# Patient Record
Sex: Female | Born: 1995 | Race: Black or African American | Hispanic: No | Marital: Single | Smoking: Never smoker
Health system: Southern US, Community
[De-identification: ages and names within clinical notes are randomized; demographics above are authoritative.]

## PROBLEM LIST (undated history)

## (undated) ENCOUNTER — Inpatient Hospital Stay (HOSPITAL_COMMUNITY): Payer: Self-pay

## (undated) DIAGNOSIS — Z789 Other specified health status: Secondary | ICD-10-CM

## (undated) HISTORY — PX: LIPOSUCTION: SHX10

## (undated) HISTORY — PX: NO PAST SURGERIES: SHX2092

---

## 2015-01-23 ENCOUNTER — Emergency Department (HOSPITAL_BASED_OUTPATIENT_CLINIC_OR_DEPARTMENT_OTHER)
Admission: EM | Admit: 2015-01-23 | Discharge: 2015-01-23 | Disposition: A | Discharge status: Home / Self Care | Payer: Self-pay | Attending: Emergency Medicine | Admitting: Emergency Medicine

## 2015-01-23 ENCOUNTER — Encounter (HOSPITAL_BASED_OUTPATIENT_CLINIC_OR_DEPARTMENT_OTHER): Payer: Self-pay | Admitting: *Deleted

## 2015-01-23 DIAGNOSIS — K088 Other specified disorders of teeth and supporting structures: Secondary | ICD-10-CM | POA: Insufficient documentation

## 2015-01-23 DIAGNOSIS — K047 Periapical abscess without sinus: Secondary | ICD-10-CM | POA: Insufficient documentation

## 2015-01-23 DIAGNOSIS — K0889 Other specified disorders of teeth and supporting structures: Secondary | ICD-10-CM

## 2015-01-23 MED ORDER — HYDROCODONE-ACETAMINOPHEN 5-325 MG PO TABS: ORAL_TABLET | ORAL | Status: DC

## 2015-01-23 MED ORDER — AMOXICILLIN 500 MG PO CAPS: 500.0000 mg | ORAL_CAPSULE | Freq: Three times a day (TID) | ORAL | Status: DC

## 2015-01-23 MED ORDER — IBUPROFEN 800 MG PO TABS
800.0000 mg | ORAL_TABLET | Freq: Once | ORAL | Status: AC
  Administered 2015-01-23: 800 mg via ORAL
  Filled 2015-01-23: qty 1

## 2015-01-23 MED ORDER — AMOXICILLIN 500 MG PO CAPS
500.0000 mg | ORAL_CAPSULE | Freq: Once | ORAL | Status: AC
  Administered 2015-01-23: 500 mg via ORAL
  Filled 2015-01-23: qty 1

## 2015-01-23 NOTE — ED Notes (Signed)
Dental pain since yesterday.  

## 2015-01-23 NOTE — ED Provider Notes (Signed)
CSN: [id number]     Arrival date & time 01/23/15  1536 History   First MD Initiated Contact with Patient 01/23/15 1541     Chief Complaint  Patient presents with  . Dental Pain     (Consider location/radiation/quality/duration/timing/severity/associated sxs/prior Treatment) HPI   Blood pressure 116/85, pulse 72, temperature 98.4 F (36.9 C), temperature source Oral, resp. rate 20, height 5\' 3"  (1.6 m), weight 100 lb (45.36 kg), last menstrual period 01/04/2015, SpO2 100 %.  Brandy Mckenzie is a 19 y.o. female complaining of right-sided dental pain with associated cheek swelling onset yesterday, no pain medication is taken prior to arrival. She rates her pain at 8 out of 10. She states that she has a Education officer, community in [place] but she was transferring to [place] and she was advised she try to make an appointment that she would have to come to the emergency room to be evaluated first. Denies fever/chills, difficulty opening jaw, difficulty swallowing, SOB,  neck swelling.   History reviewed. No pertinent past medical history. History reviewed. No pertinent past surgical history. No family history on file. History  Substance Use Topics  . Smoking status: Never Smoker   . Smokeless tobacco: Not on file  . Alcohol Use: No   OB History    No data available     Review of Systems  10 systems reviewed and found to be negative, except as noted in the HPI.   Allergies  Review of patient's allergies indicates no known allergies.  Home Medications   Prior to Admission medications   Medication Sig Start Date End Date Taking? Authorizing Provider  amoxicillin (AMOXIL) 500 MG capsule Take 1 capsule (500 mg total) by mouth 3 (three) times daily. 01/23/15   Alpha Chouinard, PA-C  HYDROcodone-acetaminophen (NORCO/VICODIN) 5-325 MG per tablet Take 1-2 tablets by mouth every 6 hours as needed for pain and/or cough. 01/23/15   Erandi Lemma, PA-C   BP 116/85 mmHg  Pulse 72  Temp(Src)  98.4 F (36.9 C) (Oral)  Resp 20  Ht 5\' 3"  (1.6 m)  Wt 100 lb (45.36 kg)  BMI 17.72 kg/m2  SpO2 100%  LMP 01/04/2015 Physical Exam  Constitutional: She is oriented to person, place, and time. She appears well-developed and well-nourished. No distress.  HENT:  Head: Normocephalic.  Mouth/Throat: Uvula is midline and oropharynx is clear and moist. No trismus in the jaw. No uvula swelling. No oropharyngeal exudate, posterior oropharyngeal edema, posterior oropharyngeal erythema or tonsillar abscesses.  Slight right-sided cheek swelling, no cellulitis or overlying skin changes. Generally good dentition, no gingival swelling, erythema. Patient is tender to palpation on the right upper gums. There is no focal fluctuance.  Patient is handling their secretions. There is no tenderness to palpation or firmness underneath tongue bilaterally. No trismus.    Eyes: Conjunctivae and EOM are normal.  Cardiovascular: Normal rate.   Pulmonary/Chest: Effort normal. No stridor.  Musculoskeletal: Normal range of motion.  Lymphadenopathy:    She has no cervical adenopathy.  Neurological: She is alert and oriented to person, place, and time.  Psychiatric: She has a normal mood and affect.  Nursing note and vitals reviewed.   ED Course  Procedures (including critical care time) Labs Review Labs Reviewed - No data to display  Imaging Review No results found.   EKG Interpretation None      MDM   Final diagnoses:  Pain, dental  Dental abscess    Filed Vitals:   01/23/15 1541  BP: 116/85  Pulse:  72  Temp: 98.4 F (36.9 C)  TempSrc: Oral  Resp: 20  Height:  (1.6 m)  Weight: 100 lb (45.36 kg)  SpO2: 100%    Medications  ibuprofen (ADVIL,MOTRIN) tablet 800 mg (not administered)  amoxicillin (AMOXIL) capsule 500 mg (not administered)    Brandy Mckenzie is a pleasant 19 y.o. female presenting with dental pain and early dental abscess. No signs of deep tissue infection. Patient  can make an appointment with the dentist after she is seen in the ED.  Evaluation does not show pathology that would require ongoing emergent intervention or inpatient treatment. Pt is hemodynamically stable and mentating appropriately. Discussed findings and plan with patient/guardian, who agrees with care plan. All questions answered. Return precautions discussed and outpatient follow up given.   New Prescriptions   AMOXICILLIN (AMOXIL) 500 MG CAPSULE    Take 1 capsule (500 mg total) by mouth 3 (three) times daily.   HYDROCODONE-ACETAMINOPHEN (NORCO/VICODIN) 5-325 MG PER TABLET    Take 1-2 tablets by mouth every 6 hours as needed for pain and/or cough.         Wynetta Emery, PA-C 01/23/15 1612  Gwyneth Sprout, MD 01/23/15 [phone]

## 2015-01-23 NOTE — Discharge Instructions (Signed)
For pain control please take ibuprofen (also known as Motrin or Advil) 800mg  (this is normally 4 over the counter pills) 3 times a day  for 5 days. Take with food to minimize stomach irritation.  Take vicodin for breakthrough pain, do not drink alcohol, drive, care for children or do other critical tasks while taking vicodin.  Return to the emergency room for fever, change in vision, redness to the face that rapidly spreads towards the eye, nausea or vomiting, difficulty swallowing or shortness of breath.   Apply warm compresses to jaw throughout the day.   Take your antibiotics as directed and to the end of the course.  Followup with a dentist is very important for ongoing evaluation and management of recurrent dental pain. Return to emergency department for emergent changing or worsening symptoms.   Abscessed Tooth An abscessed tooth is an infection around your tooth. It may be caused by holes or damage to the tooth (cavity) or a dental disease. An abscessed tooth causes mild to very bad pain in and around the tooth. See your dentist right away if you have tooth or gum pain. HOME CARE  Take your medicine as told. Finish it even if you start to feel better.  Do not drive after taking pain medicine.  Rinse your mouth (gargle) often with salt water ( teaspoon salt in 8 ounces of warm water).  Do not apply heat to the outside of your face. GET HELP RIGHT AWAY IF:   You have a temperature by mouth above 102 F (38.9 C), not controlled by medicine.  You have chills and a very bad headache.  You have problems breathing or swallowing.  Your mouth will not open.  You develop puffiness (swelling) on the neck or around the eye.  Your pain is not helped by medicine.  Your pain is getting worse instead of better. MAKE SURE YOU:   Understand these instructions.  Will watch your condition.  Will get help right away if you are not doing well or get worse. Document Released:  01/08/2008 Document Revised: 10/14/2011 Document Reviewed: 10/30/2010 [facility] Patient Information 2015 [place], [place]. This information is not intended to replace advice given to you by your health care provider. Make sure you discuss any questions you have with your health care provider.   Low-cost dental clinic: [facility]  at [phone]**  **Nuala Alpha at [phone] [address]**    You may also call [phone]  Dental Assistance If the dentist on-call cannot see you, please use the resources below:   Patients with Medicaid: [facility] [id number], [phone] [address], [phone]  If unable to pay, or uninsured, contact [facility] [phone]) or [facility] Department [phone] in [place], [phone] in [facility]) to become qualified for the adult dental clinic  Other Low-Cost Community Dental Services: [facility]- [address] Natasha Bence [place], [place], [address]    [phone], Ext. [phone]    2nd and 4th Thursday of the month at 6:30am    10 clients each day by appointment, can sometimes see walk-in     patients if someone does not show for an appointment [facility]- [address] Ether Griffins [place], [place], [address]    [phone] [facility] [address] [place], [place], [address]    [phone]  [facility] Department- [phone] [facility] Department- [phone] [facility] Department- [phone]

## 2015-06-19 ENCOUNTER — Emergency Department (HOSPITAL_BASED_OUTPATIENT_CLINIC_OR_DEPARTMENT_OTHER): Payer: Medicaid Other

## 2015-06-19 ENCOUNTER — Encounter (HOSPITAL_BASED_OUTPATIENT_CLINIC_OR_DEPARTMENT_OTHER): Payer: Self-pay

## 2015-06-19 ENCOUNTER — Emergency Department (HOSPITAL_BASED_OUTPATIENT_CLINIC_OR_DEPARTMENT_OTHER)
Admission: EM | Admit: 2015-06-19 | Discharge: 2015-06-19 | Disposition: A | Discharge status: Home / Self Care | Payer: Medicaid Other | Attending: Emergency Medicine | Admitting: Emergency Medicine

## 2015-06-19 DIAGNOSIS — Z3A14 14 weeks gestation of pregnancy: Secondary | ICD-10-CM | POA: Insufficient documentation

## 2015-06-19 DIAGNOSIS — R102 Pelvic and perineal pain unspecified side: Secondary | ICD-10-CM

## 2015-06-19 DIAGNOSIS — R51 Headache: Secondary | ICD-10-CM | POA: Diagnosis not present

## 2015-06-19 DIAGNOSIS — O9989 Other specified diseases and conditions complicating pregnancy, childbirth and the puerperium: Secondary | ICD-10-CM | POA: Diagnosis not present

## 2015-06-19 DIAGNOSIS — N898 Other specified noninflammatory disorders of vagina: Secondary | ICD-10-CM | POA: Insufficient documentation

## 2015-06-19 DIAGNOSIS — R630 Anorexia: Secondary | ICD-10-CM | POA: Insufficient documentation

## 2015-06-19 DIAGNOSIS — R55 Syncope and collapse: Secondary | ICD-10-CM | POA: Insufficient documentation

## 2015-06-19 DIAGNOSIS — R1032 Left lower quadrant pain: Secondary | ICD-10-CM

## 2015-06-19 DIAGNOSIS — O26892 Other specified pregnancy related conditions, second trimester: Secondary | ICD-10-CM

## 2015-06-19 DIAGNOSIS — R109 Unspecified abdominal pain: Secondary | ICD-10-CM

## 2015-06-19 LAB — URINALYSIS, ROUTINE W REFLEX MICROSCOPIC
BILIRUBIN URINE: NEGATIVE
Glucose, UA: NEGATIVE mg/dL
HGB URINE DIPSTICK: NEGATIVE
KETONES UR: NEGATIVE mg/dL
Leukocytes, UA: NEGATIVE
NITRITE: NEGATIVE
Protein, ur: NEGATIVE mg/dL
SPECIFIC GRAVITY, URINE: 1.022 (ref 1.005–1.030)
Urobilinogen, UA: 1 mg/dL (ref 0.0–1.0)
pH: 6.5 (ref 5.0–8.0)

## 2015-06-19 LAB — CBC WITH DIFFERENTIAL/PLATELET
Basophils Absolute: 0 10*3/uL (ref 0.0–0.1)
Basophils Relative: 0 %
EOS ABS: 0.5 10*3/uL (ref 0.0–0.7)
EOS PCT: 5 %
HEMATOCRIT: 36.9 % (ref 36.0–46.0)
HEMOGLOBIN: 12.9 g/dL (ref 12.0–15.0)
LYMPHS ABS: 2.3 10*3/uL (ref 0.7–4.0)
Lymphocytes Relative: 23 %
MCH: 31.6 pg (ref 26.0–34.0)
MCHC: 35 g/dL (ref 30.0–36.0)
MCV: 90.4 fL (ref 78.0–100.0)
MONOS PCT: 11 %
Monocytes Absolute: 1.1 10*3/uL — ABNORMAL HIGH (ref 0.1–1.0)
Neutro Abs: 6.2 10*3/uL (ref 1.7–7.7)
Neutrophils Relative %: 61 %
Platelets: 226 10*3/uL (ref 150–400)
RBC: 4.08 MIL/uL (ref 3.87–5.11)
RDW: 11.7 % (ref 11.5–15.5)
WBC: 10.1 10*3/uL (ref 4.0–10.5)

## 2015-06-19 LAB — COMPREHENSIVE METABOLIC PANEL
ALK PHOS: 32 U/L — AB (ref 38–126)
ALT: 19 U/L (ref 14–54)
ANION GAP: 5 (ref 5–15)
AST: 22 U/L (ref 15–41)
Albumin: 3.4 g/dL — ABNORMAL LOW (ref 3.5–5.0)
BILIRUBIN TOTAL: 0.5 mg/dL (ref 0.3–1.2)
BUN: 10 mg/dL (ref 6–20)
CALCIUM: 9 mg/dL (ref 8.9–10.3)
CO2: 25 mmol/L (ref 22–32)
CREATININE: 0.53 mg/dL (ref 0.44–1.00)
Chloride: 105 mmol/L (ref 101–111)
GFR calc non Af Amer: >60 mL/min (ref >60)
Glucose, Bld: 81 mg/dL (ref 65–99)
Potassium: 3.4 mmol/L — ABNORMAL LOW (ref 3.5–5.1)
Sodium: 135 mmol/L (ref 135–145)
TOTAL PROTEIN: 6.9 g/dL (ref 6.5–8.1)

## 2015-06-19 LAB — WET PREP, GENITAL
TRICH WET PREP: NONE SEEN
YEAST WET PREP: NONE SEEN

## 2015-06-19 LAB — LIPASE, BLOOD: Lipase: 33 U/L (ref 11–51)

## 2015-06-19 MED ORDER — METRONIDAZOLE 500 MG PO TABS: 500.0000 mg | ORAL_TABLET | Freq: Two times a day (BID) | ORAL | Status: DC

## 2015-06-19 MED ORDER — ACETAMINOPHEN 325 MG PO TABS
650.0000 mg | ORAL_TABLET | Freq: Once | ORAL | Status: AC
  Administered 2015-06-19: 650 mg via ORAL
  Filled 2015-06-19: qty 2

## 2015-06-19 NOTE — ED Notes (Signed)
Attempted to obtain blood for labs ordered by EDP, pt refused blood draw, but would not give a primary reason, pt did state she was "scared" explained rationale for lab sample and importance for EDP to obtain a correct diagnosis so that we could provide the appropriate care for her, pt still refused, will reck with pt in 5 min

## 2015-06-19 NOTE — ED Notes (Signed)
C/o lower abd pain x 4 days-[redacted] weeks pregnant-states OB nurse advised pt to go to ED or schedule appt-denies vaginal bleeding and d/c-G1

## 2015-06-19 NOTE — ED Notes (Signed)
Pt agreed to blood draw, samples obtained and to lab per EDP orders

## 2015-06-19 NOTE — ED Notes (Signed)
Attempted to administer patient tylenol.  Patient in ultrasound

## 2015-06-19 NOTE — ED Notes (Signed)
Pt presents with left lower quad abd pain, with onset this past Friday. Describes as aching pain, appetite poor, denies any N/V/D, denies any fevers

## 2015-06-19 NOTE — ED Provider Notes (Signed)
CSN: [id number]     Arrival date & time 06/19/15  1641 History  By signing my name below, I, Budd Palmer, attest that this documentation has been prepared under the direction and in the presence of Leta Baptist, MD. Electronically Signed: Budd Palmer, ED Scribe. 06/19/2015. 6:17 PM.    Chief Complaint  Patient presents with  . Abdominal Pain   The history is provided by the patient. No language interpreter was used.   HPI Comments: Brandy Mckenzie is a 19 y.o. female who is [redacted] weeks pregnant and presents to the Emergency Department complaining of constant, aching LLQ abdominal pain onset 3 days ago. She reports associated HA and loss of appetite. She notes she is sexually active with one partner and denies a PMHx of STD's. She notes she was last seen by her OB-GYN at 8 weeks, at which point a normal [place] was performed. She denies a PMHx of UTI. Pt denies fever, chills, n/v/d, vaginal discharge, bleeding, or pain, as well as dysuria.   History reviewed. No pertinent past medical history. History reviewed. No pertinent past surgical history. No family history on file. Social History  Substance Use Topics  . Smoking status: Never Smoker   . Smokeless tobacco: None  . Alcohol Use: No   OB History    Gravida Para Term Preterm AB TAB SAB Ectopic Multiple Living   1              Review of Systems  Constitutional: Positive for appetite change. Negative for fever and chills.  Gastrointestinal: Positive for abdominal pain. Negative for nausea, vomiting and diarrhea.  Genitourinary: Negative for dysuria, vaginal bleeding, vaginal discharge and vaginal pain.  Neurological: Positive for headaches.    Allergies  Review of patient's allergies indicates no known allergies.  Home Medications   Prior to Admission medications   Medication Sig Start Date End Date Taking? Authorizing Provider  metroNIDAZOLE (FLAGYL) 500 MG tablet Take 1 tablet (500 mg total) by mouth 2 (two) times  daily. 06/19/15   Leta Baptist, MD   BP 122/87 mmHg  Pulse 79  Temp(Src) 98.1 F (36.7 C) (Oral)  Resp 16  SpO2 100%  LMP 01/04/2015 Physical Exam  Constitutional: She is oriented to person, place, and time. She appears well-developed and well-nourished. No distress.  HENT:  Head: Normocephalic and atraumatic.  Right Ear: External ear normal.  Left Ear: External ear normal.  Nose: Nose normal.  Mouth/Throat: Oropharynx is clear and moist. No oropharyngeal exudate.  Eyes: EOM are normal. Pupils are equal, round, and reactive to light.  Neck: Normal range of motion. Neck supple.  Cardiovascular: Normal rate, regular rhythm, normal heart sounds and intact distal pulses.   No murmur heard. Pulmonary/Chest: Effort normal. No respiratory distress. She has no wheezes. She has no rales.  Abdominal: Soft. She exhibits no distension and no mass. There is tenderness (LLQ). There is no rebound and no guarding.  Genitourinary: Uterus is enlarged (consistent with dates). Uterus is not deviated and not tender. Cervix exhibits discharge (mild, whitish). Cervix exhibits no motion tenderness. Right adnexum displays no mass, no tenderness and no fullness. Left adnexum displays tenderness. Left adnexum displays no mass and no fullness. No foreign body around the vagina. No signs of injury around the vagina. Vaginal discharge found.  Musculoskeletal: Normal range of motion. She exhibits no edema or tenderness.  Neurological: She is alert and oriented to person, place, and time.  Skin: Skin is warm and dry. No rash noted.  She is not diaphoretic.  Vitals reviewed.   ED Course  Procedures  DIAGNOSTIC STUDIES: Oxygen Saturation is 100% on RA, normal by my interpretation.    COORDINATION OF CARE: 6:16 PM - Discussed plans to perform a pelvic exam and to order diagnostic studies. Pt advised of plan for treatment and pt agrees.  Labs Review Labs Reviewed  WET PREP, GENITAL - Abnormal; Notable for the  following:    Clue Cells Wet Prep HPF POC FEW (*)    WBC, Wet Prep HPF POC FEW (*)    All other components within normal limits  CBC WITH DIFFERENTIAL/PLATELET - Abnormal; Notable for the following:    Monocytes Absolute 1.1 (*)    All other components within normal limits  COMPREHENSIVE METABOLIC PANEL - Abnormal; Notable for the following:    Potassium 3.4 (*)    Albumin 3.4 (*)    Alkaline Phosphatase 32 (*)    All other components within normal limits  URINALYSIS, ROUTINE W REFLEX MICROSCOPIC (NOT AT [facility])  LIPASE, BLOOD  GC/CHLAMYDIA PROBE AMP ([place]) NOT AT [facility]    Imaging Review Kore[place] Ob Limited  06/19/2015  CLINICAL DATA:  Acute onset of left pelvic and flank pain. Initial encounter. EXAM: LIMITED OBSTETRIC ULTRASOUND AND DOPPLER FINDINGS: Number of Fetuses: 1 Heart Rate:  144 bpm Movement: Yes Presentation: Cephalic Placental Location: Posterior Previa: No Amniotic Fluid (Subjective):  Within normal limits. BPD:  2.8 cm    15 w 0 d MATERNAL FINDINGS: Cervix:  Appears closed. Uterus/Adnexae: No abnormality visualized. Pulsed Doppler evaluation of the left ovary demonstrates normal low-resistance arterial and venous waveforms. The right ovary is not visualized on this study. Other findings No free fluid is seen within the pelvic cul-de-sac. IMPRESSION: 1. No evidence for ovarian torsion. The left ovary is unremarkable in appearance, and demonstrates normal arterial and venous waveforms. The right ovary is not visualized on this study. 2. Single live intrauterine pregnancy noted, with a biparietal diameter of 2.8 cm, corresponding to a gestational age of [redacted] weeks 0 days. This matches the gestational age of [redacted] weeks 3 days by LMP, reflecting an estimated date of delivery of Dec 15, 2015. 3. Cervix remains closed.  No evidence of placenta previa. This exam is performed on an emergent basis and does not comprehensively evaluate fetal size, dating, or anatomy; follow-up complete OB US should  be considered if further fetal assessment is warranted. Electronically Signed   By: Roanna RaiderJeffery  Chang M.D.   On: 06/19/2015 21:04   [place]as Renal  06/19/2015  CLINICAL DATA:  Left flank and left lower quadrant abdominal pain. Patient is [redacted] weeks pregnant. EXAM: RENAL / URINARY TRACT ULTRASOUND COMPLETE COMPARISON:  None. FINDINGS: Right Kidney: Length: 10.2 cm. Normal renal cortical thickness and echogenicity without focal lesions or hydronephrosis. No renal calculi. Left Kidney: Length: 9.6 cm. Normal renal cortical thickness and echogenicity without focal lesions or hydronephrosis. No renal calculi. Bladder: Normal. IMPRESSION: Normal renal ultrasound examination. Electronically Signed   By: Rudie MeyerP.  Gallerani M.D.   On: 06/19/2015 20:21   [place] Art/ven Flow Abd Pelv Doppler  06/19/2015  CLINICAL DATA:  Acute onset of left pelvic and flank pain. Initial encounter. EXAM: LIMITED OBSTETRIC ULTRASOUND AND DOPPLER FINDINGS: Number of Fetuses: 1 Heart Rate:  144 bpm Movement: Yes Presentation: Cephalic Placental Location: Posterior Previa: No Amniotic Fluid (Subjective):  Within normal limits. BPD:  2.8 cm    15 w 0 d MATERNAL FINDINGS: Cervix:  Appears closed. Uterus/Adnexae: No abnormality visualized. Pulsed  Doppler evaluation of the left ovary demonstrates normal low-resistance arterial and venous waveforms. The right ovary is not visualized on this study. Other findings No free fluid is seen within the pelvic cul-de-sac. IMPRESSION: 1. No evidence for ovarian torsion. The left ovary is unremarkable in appearance, and demonstrates normal arterial and venous waveforms. The right ovary is not visualized on this study. 2. Single live intrauterine pregnancy noted, with a biparietal diameter of 2.8 cm, corresponding to a gestational age of [redacted] weeks 0 days. This matches the gestational age of [redacted] weeks 3 days by LMP, reflecting an estimated date of delivery of Dec 15, 2015. 3. Cervix remains closed.  No evidence of placenta  previa. This exam is performed on an emergent basis and does not comprehensively evaluate fetal size, dating, or anatomy; follow-up complete OB US should be considered if further fetal assessment is warranted. Electronically Signed   By: Roanna Raider M.D.   On: 06/19/2015 21:04   I have personally reviewed and evaluated these images and lab results as part of my medical decision-making.   EKG Interpretation None      MDM  Patient seen and evaluated in stable condition.  Mild left lower quadrant pain.  Renal and pelvic US unremarkable.  UA Laboratory studies unremarkable other than wet prep positive for clue cells.  Discussed results with patient.  Likely pain secondary to early pregnancy physiologic changes and BV.  Patient just had cultures for GC/Chlamydia which were negative and so at this time was not empirically treated for GC/chlamydia but cultures were sent and patient was informed she would be contacted if they are positive.  Patient was instructed to follow up outpatient with her OB/PCP.  She was also instructed to return with fever, inability to tolerate PO, if pain moved to RLQ.  She expressed understanding and agreement with all discussed and she was discharged home in stable condition. Final diagnoses:  Abdominal pain, unspecified abdominal location    1. BV  2. Abdominal pain in pregnancy  I personally performed the services described in this documentation, which was scribed in my presence. The recorded information has been reviewed and is accurate.   Leta Baptist, MD 06/20/15 [phone]

## 2015-06-19 NOTE — Discharge Instructions (Signed)
The pain in your abdomen is likely related to the stretching of your pelvis and a bacterial infection in your vagina.  Take the medications as prescribed.  Follow up with your OB or primary care physician.  Return with inability to eat or drink or if the pain moves to the right side of your abdomen.  Abdominal Pain During Pregnancy Abdominal pain is common in pregnancy. Most of the time, it does not cause harm. There are many causes of abdominal pain. Some causes are more serious than others. Some of the causes of abdominal pain in pregnancy are easily diagnosed. Occasionally, the diagnosis takes time to understand. Other times, the cause is not determined. Abdominal pain can be a sign that something is very wrong with the pregnancy, or the pain may have nothing to do with the pregnancy at all. For this reason, always tell your health care provider if you have any abdominal discomfort. HOME CARE INSTRUCTIONS  Monitor your abdominal pain for any changes. The following actions may help to alleviate any discomfort you are experiencing:  Do not have sexual intercourse or put anything in your vagina until your symptoms go away completely.  Get plenty of rest until your pain improves.  Drink clear fluids if you feel nauseous. Avoid solid food as long as you are uncomfortable or nauseous.  Only take over-the-counter or prescription medicine as directed by your health care provider.  Keep all follow-up appointments with your health care provider. SEEK IMMEDIATE MEDICAL CARE IF:  You are bleeding, leaking fluid, or passing tissue from the vagina.  You have increasing pain or cramping.  You have persistent vomiting.  You have painful or bloody urination.  You have a fever.  You notice a decrease in your baby's movements.  You have extreme weakness or feel faint.  You have shortness of breath, with or without abdominal pain.  You develop a severe headache with abdominal pain.  You have  abnormal vaginal discharge with abdominal pain.  You have persistent diarrhea.  You have abdominal pain that continues even after rest, or gets worse. MAKE SURE YOU:   Understand these instructions.  Will watch your condition.  Will get help right away if you are not doing well or get worse.   This information is not intended to replace advice given to you by your health care provider. Make sure you discuss any questions you have with your health care provider.   Document Released: 07/22/2005 Document Revised: 05/12/2013 Document Reviewed: 02/18/2013 Elsevier Interactive Patient Education 2016 Elsevier Inc.   Bacterial Vaginosis Bacterial vaginosis is a vaginal infection that occurs when the normal balance of bacteria in the vagina is disrupted. It results from an overgrowth of certain bacteria. This is the most common vaginal infection in women of childbearing age. Treatment is important to prevent complications, especially in pregnant women, as it can cause a premature delivery. CAUSES  Bacterial vaginosis is caused by an increase in harmful bacteria that are normally present in smaller amounts in the vagina. Several different kinds of bacteria can cause bacterial vaginosis. However, the reason that the condition develops is not fully understood. RISK FACTORS Certain activities or behaviors can put you at an increased risk of developing bacterial vaginosis, including:  Having a new sex partner or multiple sex partners.  Douching.  Using an intrauterine device (IUD) for contraception. Women do not get bacterial vaginosis from toilet seats, bedding, swimming pools, or contact with objects around them. SIGNS AND SYMPTOMS  Some women with  bacterial vaginosis have no signs or symptoms. Common symptoms include:  Grey vaginal discharge.  A fishlike odor with discharge, especially after sexual intercourse.  Itching or burning of the vagina and vulva.  Burning or pain with  urination. DIAGNOSIS  Your health care provider will take a medical history and examine the vagina for signs of bacterial vaginosis. A sample of vaginal fluid may be taken. Your health care provider will look at this sample under a microscope to check for bacteria and abnormal cells. A vaginal pH test may also be done.  TREATMENT  Bacterial vaginosis may be treated with antibiotic medicines. These may be given in the form of a pill or a vaginal cream. A second round of antibiotics may be prescribed if the condition comes back after treatment. Because bacterial vaginosis increases your risk for sexually transmitted diseases, getting treated can help reduce your risk for chlamydia, gonorrhea, HIV, and herpes. HOME CARE INSTRUCTIONS   Only take over-the-counter or prescription medicines as directed by your health care provider.  If antibiotic medicine was prescribed, take it as directed. Make sure you finish it even if you start to feel better.  Tell all sexual partners that you have a vaginal infection. They should see their health care provider and be treated if they have problems, such as a mild rash or itching.  During treatment, it is important that you follow these instructions:  Avoid sexual activity or use condoms correctly.  Do not douche.  Avoid alcohol as directed by your health care provider.  Avoid breastfeeding as directed by your health care provider. SEEK MEDICAL CARE IF:   Your symptoms are not improving after 3 days of treatment.  You have increased discharge or pain.  You have a fever. MAKE SURE YOU:   Understand these instructions.  Will watch your condition.  Will get help right away if you are not doing well or get worse. FOR MORE INFORMATION  Centers for Disease Control and Prevention, Division of STD Prevention: [employer] American Sexual Health Association (ASHA): www.ashastd.org    This information is not intended to replace advice given to you by  your health care provider. Make sure you discuss any questions you have with your health care provider.   Document Released: 07/22/2005 Document Revised: 08/12/2014 Document Reviewed: 03/03/2013 Elsevier Interactive Patient Education [employer]

## 2015-06-19 NOTE — ED Notes (Signed)
Refused blood work. RN made aware

## 2015-06-19 NOTE — ED Notes (Signed)
Patient transported to Ultrasound, via stretcher,sr x 2

## 2015-06-20 LAB — GC/CHLAMYDIA PROBE AMP (~~LOC~~) NOT AT ARMC
Chlamydia: NEGATIVE
Neisseria Gonorrhea: NEGATIVE

## 2015-08-01 ENCOUNTER — Emergency Department (HOSPITAL_BASED_OUTPATIENT_CLINIC_OR_DEPARTMENT_OTHER)
Admission: EM | Admit: 2015-08-01 | Discharge: 2015-08-01 | Disposition: A | Discharge status: Home / Self Care | Payer: Medicaid Other | Attending: Emergency Medicine | Admitting: Emergency Medicine

## 2015-08-01 ENCOUNTER — Encounter (HOSPITAL_BASED_OUTPATIENT_CLINIC_OR_DEPARTMENT_OTHER): Payer: Self-pay | Admitting: *Deleted

## 2015-08-01 DIAGNOSIS — R69 Illness, unspecified: Secondary | ICD-10-CM

## 2015-08-01 DIAGNOSIS — Z3A20 20 weeks gestation of pregnancy: Secondary | ICD-10-CM | POA: Diagnosis not present

## 2015-08-01 DIAGNOSIS — J111 Influenza due to unidentified influenza virus with other respiratory manifestations: Secondary | ICD-10-CM | POA: Insufficient documentation

## 2015-08-01 DIAGNOSIS — O99512 Diseases of the respiratory system complicating pregnancy, second trimester: Secondary | ICD-10-CM | POA: Insufficient documentation

## 2015-08-01 DIAGNOSIS — Z3490 Encounter for supervision of normal pregnancy, unspecified, unspecified trimester: Secondary | ICD-10-CM

## 2015-08-01 DIAGNOSIS — O9989 Other specified diseases and conditions complicating pregnancy, childbirth and the puerperium: Secondary | ICD-10-CM | POA: Diagnosis present

## 2015-08-01 LAB — URINE MICROSCOPIC-ADD ON

## 2015-08-01 LAB — URINALYSIS, ROUTINE W REFLEX MICROSCOPIC
BILIRUBIN URINE: NEGATIVE
GLUCOSE, UA: NEGATIVE mg/dL
Hgb urine dipstick: NEGATIVE
Ketones, ur: NEGATIVE mg/dL
Nitrite: NEGATIVE
PH: 7 (ref 5.0–8.0)
Protein, ur: NEGATIVE mg/dL
Specific Gravity, Urine: 1.021 (ref 1.005–1.030)

## 2015-08-01 LAB — RAPID STREP SCREEN (MED CTR MEBANE ONLY): Streptococcus, Group A Screen (Direct): NEGATIVE

## 2015-08-01 MED ORDER — ACETAMINOPHEN 500 MG PO TABS: 1000.0000 mg | ORAL_TABLET | Freq: Four times a day (QID) | ORAL | Status: DC | PRN

## 2015-08-01 MED ORDER — ACETAMINOPHEN 500 MG PO TABS
1000.0000 mg | ORAL_TABLET | Freq: Once | ORAL | Status: AC
  Administered 2015-08-01: 1000 mg via ORAL
  Filled 2015-08-01: qty 2

## 2015-08-01 NOTE — ED Notes (Signed)
Pt c/o URi symptoms x 2 days. p tis 20 weeks pre g

## 2015-08-01 NOTE — Discharge Instructions (Signed)
Upper Respiratory Infection, Adult Since you are pregnant, use Tylenol for pain. You may use cool mist humidifiers for relief of nasal dryness or stuffiness. Most upper respiratory infections (URIs) are a viral infection of the air passages leading to the lungs. A URI affects the nose, throat, and upper air passages. The most common type of URI is nasopharyngitis and is typically referred to as "the common cold." URIs run their course and usually go away on their own. Most of the time, a URI does not require medical attention, but sometimes a bacterial infection in the upper airways can follow a viral infection. This is called a secondary infection. Sinus and middle ear infections are common types of secondary upper respiratory infections. Bacterial pneumonia can also complicate a URI. A URI can worsen asthma and chronic obstructive pulmonary disease (COPD). Sometimes, these complications can require emergency medical care and may be life threatening.  CAUSES Almost all URIs are caused by viruses. A virus is a type of germ and can spread from one person to another.  RISKS FACTORS You may be at risk for a URI if:   You smoke.   You have chronic heart or lung disease.  You have a weakened defense (immune) system.   You are very young or very old.   You have nasal allergies or asthma.  You work in crowded or poorly ventilated areas.  You work in health care facilities or schools. SIGNS AND SYMPTOMS  Symptoms typically develop 2-3 days after you come in contact with a cold virus. Most viral URIs last 7-10 days. However, viral URIs from the influenza virus (flu virus) can last 14-18 days and are typically more severe. Symptoms may include:   Runny or stuffy (congested) nose.   Sneezing.   Cough.   Sore throat.   Headache.   Fatigue.   Fever.   Loss of appetite.   Pain in your forehead, behind your eyes, and over your cheekbones (sinus pain).  Muscle aches.   DIAGNOSIS  Your health care provider may diagnose a URI by:  Physical exam.  Tests to check that your symptoms are not due to another condition such as:  Strep throat.  Sinusitis.  Pneumonia.  Asthma. TREATMENT  A URI goes away on its own with time. It cannot be cured with medicines, but medicines may be prescribed or recommended to relieve symptoms. Medicines may help:  Reduce your fever.  Reduce your cough.  Relieve nasal congestion. HOME CARE INSTRUCTIONS   Take medicines only as directed by your health care provider.   Gargle warm saltwater or take cough drops to comfort your throat as directed by your health care provider.  Use a warm mist humidifier or inhale steam from a shower to increase air moisture. This may make it easier to breathe.  Drink enough fluid to keep your urine clear or pale yellow.   Eat soups and other clear broths and maintain good nutrition.   Rest as needed.   Return to work when your temperature has returned to normal or as your health care provider advises. You may need to stay home longer to avoid infecting others. You can also use a face mask and careful hand washing to prevent spread of the virus.  Increase the usage of your inhaler if you have asthma.   Do not use any tobacco products, including cigarettes, chewing tobacco, or electronic cigarettes. If you need help quitting, ask your health care provider. PREVENTION  The best way to protect  yourself from getting a cold is to practice good hygiene.   Avoid oral or hand contact with people with cold symptoms.   Wash your hands often if contact occurs.  There is no clear evidence that vitamin C, vitamin E, echinacea, or exercise reduces the chance of developing a cold. However, it is always recommended to get plenty of rest, exercise, and practice good nutrition.  SEEK MEDICAL CARE IF:   You are getting worse rather than better.   Your symptoms are not controlled by  medicine.   You have chills.  You have worsening shortness of breath.  You have brown or red mucus.  You have yellow or brown nasal discharge.  You have pain in your face, especially when you bend forward.  You have a fever.  You have swollen neck glands.  You have pain while swallowing.  You have white areas in the back of your throat. SEEK IMMEDIATE MEDICAL CARE IF:   You have severe or persistent:  Headache.  Ear pain.  Sinus pain.  Chest pain.  You have chronic lung disease and any of the following:  Wheezing.  Prolonged cough.  Coughing up blood.  A change in your usual mucus.  You have a stiff neck.  You have changes in your:  Vision.  Hearing.  Thinking.  Mood. MAKE SURE YOU:   Understand these instructions.  Will watch your condition.  Will get help right away if you are not doing well or get worse.   This information is not intended to replace advice given to you by your health care provider. Make sure you discuss any questions you have with your health care provider.   Document Released: 01/15/2001 Document Revised: 12/06/2014 Document Reviewed: 10/27/2013 Elsevier Interactive Patient Education [employer] Elsevier Inc.

## 2015-08-01 NOTE — ED Provider Notes (Signed)
CSN: [id number]     Arrival date & time 08/01/15  1825 History  By signing my name below, I, Doreatha MartinEva Mathews, attest that this documentation has been prepared under the direction and in the presence of Arby BarretteMarcy Evony Rezek, MD. Electronically Signed: Doreatha MartinEva Mathews, ED Scribe. 08/01/2015. 10:10 PM.    Chief Complaint  Patient presents with  . URI   The history is provided by the patient. No language interpreter was used.    HPI Comments: Brandy Mckenzie is a 19 y.o. female who is [redacted] weeks pregnant who presents to the Emergency Department complaining of moderate, worsening HA and sinus pain onset 2 days ago and worsened yesterday with associated generalized myalgias, nasal congestion, HA, cough, sore throat. 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 She denies SOB, nausea, emesis, diarrhea, fever, dysuria, hematuria.   History reviewed. No pertinent past medical history. History reviewed. No pertinent past surgical history. History reviewed. No pertinent family history. Social History  Substance Use Topics  . Smoking status: Never Smoker   . Smokeless tobacco: None  . Alcohol Use: No   OB History    Gravida Para Term Preterm AB TAB SAB Ectopic Multiple Living   1              Review of Systems 10 Systems reviewed and are negative for acute change except as noted in the HPI.   Allergies  Review of patient's allergies indicates no known allergies.  Home Medications   Prior to Admission medications   Medication Sig Start Date End Date Taking? Authorizing Provider  acetaminophen (TYLENOL) 500 MG tablet Take 2 tablets (1,000 mg total) by mouth every 6 (six) hours as needed. 08/01/15   Arby BarretteMarcy Mical Brun, MD   BP 113/74 mmHg  Pulse 106  Temp(Src) 99.2 F (37.3 C) (Oral)  Resp 18  Ht 5\' 6"  (1.676 m)  Wt 106 lb (48.081 kg)  BMI 17.12 kg/m2  SpO2 100%  LMP 03/22/2015 Physical Exam  Constitutional: She is oriented to person, place, and  time. She appears well-developed and well-nourished.  HENT:  Head: Normocephalic and atraumatic.  Nose: Nose normal.  Mouth/Throat: Oropharynx is clear and moist.  Eyes: Conjunctivae and EOM are normal. Pupils are equal, round, and reactive to light.  Neck: Normal range of motion. Neck supple.  Cardiovascular: Normal rate, regular rhythm, normal heart sounds and intact distal pulses.   Pulmonary/Chest: Effort normal and breath sounds normal. No respiratory distress.  Abdominal: Soft. She exhibits no distension. There is no tenderness.  Gravid abdomen, nontender.  Musculoskeletal: Normal range of motion. She exhibits no edema or tenderness.  Neurological: She is alert and oriented to person, place, and time.  Skin: Skin is warm and dry.  Psychiatric: She has a normal mood and affect. Her behavior is normal.  Nursing note and vitals reviewed.   ED Course  Procedures (including critical care time) DIAGNOSTIC STUDIES: Oxygen Saturation is 99% on RA, normal by my interpretation.    COORDINATION OF CARE: 10:07 PM Discussed treatment plan with pt at bedside which includes urinalysis and rapid strep test and pt agreed to plan.  Results for orders placed or performed during the hospital encounter of 08/01/15  Rapid strep screen  Result Value Ref Range   Streptococcus, Group A Screen (Direct) NEGATIVE NEGATIVE  Culture, Group A Strep  Result Value Ref Range   Strep A Culture Negative   Urine culture  Result Value Ref Range  Specimen Description URINE, CLEAN CATCH    Special Requests NONE    Culture      MULTIPLE SPECIES PRESENT, SUGGEST RECOLLECTION Performed at [facility]    Report Status 08/03/2015 FINAL   Urinalysis, Routine w reflex microscopic  Result Value Ref Range   Color, Urine YELLOW YELLOW   APPearance CLOUDY (A) CLEAR   Specific Gravity, Urine 1.021 1.005 - 1.030   pH 7.0 5.0 - 8.0   Glucose, UA NEGATIVE NEGATIVE mg/dL   Hgb urine dipstick NEGATIVE  NEGATIVE   Bilirubin Urine NEGATIVE NEGATIVE   Ketones, ur NEGATIVE NEGATIVE mg/dL   Protein, ur NEGATIVE NEGATIVE mg/dL   Nitrite NEGATIVE NEGATIVE   Leukocytes, UA SMALL (A) NEGATIVE  Urine microscopic-add on  Result Value Ref Range   Squamous Epithelial / LPF 6-30 (A) NONE SEEN   WBC, UA 6-30 0 - 5 WBC/hpf   RBC / HPF 0-5 0 - 5 RBC/hpf   Bacteria, UA MANY (A) NONE SEEN   Urine-Other MUCOUS PRESENT    I have personally reviewed and evaluated these lab results as part of my medical decision-making.     MDM   Final diagnoses:  Influenza-like illness  Pregnancy   Patient presents with URI-type symptoms. She also has myalgias and headache. She does not have any pregnancy related complaints. Abdomen is soft and nontender. Urinalysis is negative for any signs of infection. She is alert and well and at this point I will be treated conservatively for a suspected viral influenza-like illness.  Arby Barrette, MD 08/12/15 [phone]

## 2015-08-03 LAB — URINE CULTURE

## 2015-08-04 LAB — CULTURE, GROUP A STREP: Strep A Culture: NEGATIVE

## 2015-10-21 ENCOUNTER — Encounter (HOSPITAL_BASED_OUTPATIENT_CLINIC_OR_DEPARTMENT_OTHER): Payer: Self-pay

## 2015-10-21 ENCOUNTER — Inpatient Hospital Stay (HOSPITAL_BASED_OUTPATIENT_CLINIC_OR_DEPARTMENT_OTHER)
Admission: AD | Admit: 2015-10-21 | Discharge: 2015-10-22 | Disposition: A | Discharge status: Home / Self Care | Payer: Medicaid Other | Source: Ambulatory Visit | Attending: Obstetrics & Gynecology | Admitting: Obstetrics & Gynecology

## 2015-10-21 DIAGNOSIS — O9989 Other specified diseases and conditions complicating pregnancy, childbirth and the puerperium: Secondary | ICD-10-CM | POA: Diagnosis not present

## 2015-10-21 DIAGNOSIS — R109 Unspecified abdominal pain: Secondary | ICD-10-CM | POA: Diagnosis not present

## 2015-10-21 DIAGNOSIS — M549 Dorsalgia, unspecified: Secondary | ICD-10-CM | POA: Diagnosis not present

## 2015-10-21 DIAGNOSIS — O26899 Other specified pregnancy related conditions, unspecified trimester: Secondary | ICD-10-CM

## 2015-10-21 DIAGNOSIS — O4700 False labor before 37 completed weeks of gestation, unspecified trimester: Secondary | ICD-10-CM | POA: Diagnosis present

## 2015-10-21 DIAGNOSIS — Z3A31 31 weeks gestation of pregnancy: Secondary | ICD-10-CM | POA: Insufficient documentation

## 2015-10-21 DIAGNOSIS — O26893 Other specified pregnancy related conditions, third trimester: Secondary | ICD-10-CM | POA: Insufficient documentation

## 2015-10-21 HISTORY — DX: Other specified health status: Z78.9

## 2015-10-21 LAB — BASIC METABOLIC PANEL
Anion gap: 11 (ref 5–15)
BUN: 7 mg/dL (ref 6–20)
CHLORIDE: 104 mmol/L (ref 101–111)
CO2: 22 mmol/L (ref 22–32)
CREATININE: 0.55 mg/dL (ref 0.44–1.00)
Calcium: 8.5 mg/dL — ABNORMAL LOW (ref 8.9–10.3)
Glucose, Bld: 87 mg/dL (ref 65–99)
Potassium: 3.5 mmol/L (ref 3.5–5.1)
SODIUM: 137 mmol/L (ref 135–145)

## 2015-10-21 MED ORDER — SODIUM CHLORIDE 0.9 % IV BOLUS (SEPSIS)
1000.0000 mL | Freq: Once | INTRAVENOUS | Status: AC
  Administered 2015-10-21: 1000 mL via INTRAVENOUS

## 2015-10-21 MED ORDER — NIFEDIPINE 10 MG PO CAPS
10.0000 mg | ORAL_CAPSULE | ORAL | Status: DC
  Filled 2015-10-21: qty 1

## 2015-10-21 MED ORDER — LACTATED RINGERS IV BOLUS (SEPSIS)
1000.0000 mL | INTRAVENOUS | Status: AC
  Administered 2015-10-21: 1000 mL via INTRAVENOUS

## 2015-10-21 MED ORDER — TERBUTALINE SULFATE 1 MG/ML IJ SOLN
0.2500 mg | Freq: Once | INTRAMUSCULAR | Status: AC
  Administered 2015-10-21: 0.25 mg via SUBCUTANEOUS
  Filled 2015-10-21: qty 1

## 2015-10-21 MED ORDER — TERBUTALINE SULFATE 1 MG/ML IJ SOLN
0.2500 mg | INTRAMUSCULAR | Status: DC | PRN
  Administered 2015-10-21: 0.25 mg via SUBCUTANEOUS

## 2015-10-21 MED ORDER — SODIUM CHLORIDE 0.9 % IV BOLUS (SEPSIS)
1000.0000 mL | Freq: Once | INTRAVENOUS | Status: DC
Start: 2015-10-21 — End: 2015-10-22

## 2015-10-21 NOTE — ED Notes (Signed)
Pt to hospital G1P0 at 30.3 with complaints of abdominal pain/pressure; reports no leaking of fluid and no vaginal bleeding. Pt was at [facility] for PTL/PTC and was given meds to stop contractions/labor a few days ago and discharged.

## 2015-10-21 NOTE — ED Notes (Addendum)
Pt reports she is G1, P0, states she is [redacted] weeks pregnant, mucus plug came out Monday 3/13, pt ranged from 2cm dilated to 4cm dilated for which she was given "shots and IV" - pt dc from hospital Wednesday and pt reports she was 2cm dilated at that time. Pt denies any complications or dx of HTN, DM. States [facility], but does not recall name of provider because she is new to practice. Pt [facility], Pt reports +fetal movement, denies vaginal discharge, bleeding, or fluid leakage. Pt states she is taking her prenatal vitamins. Pt reports pelvic pain since 1600, that is new. Pt to room 14, [employer] RN called.

## 2015-10-21 NOTE — ED Notes (Signed)
RROB spoke with Christy,RN; discussed plan of care.

## 2015-10-21 NOTE — Progress Notes (Signed)
RROB spoke with Kayla,RN at [facility] to inform that pt is contracting; told of RROB consult with faculty practice OB provider Cherre BlancKim Shaw,CNM; CNM reviewed strip and gave orders; RROB told Kayla,RN of orders: 1 liter of lactated ringers bolus over 1hr, procardia 10mg  po q 20min x 3; U/A; need to check pt's cervix; if pt continues to have pain and is contracting after 1hr then need to consult with Dr [facility] about plan of care [phone]. Orders put in by RROB.

## 2015-10-21 NOTE — ED Provider Notes (Signed)
CSN: [id number]     Arrival date & time 10/21/15  2207 History  By signing my name below, I, [facility], attest that this documentation has been prepared under the direction and in the presence of Swathi Dauphin, MD. Electronically Signed: Randell Patient, ED Scribe. 10/21/2015. 11:12 PM.   Chief Complaint  Patient presents with  . ?labor 8 months preg     Patient is a 20 y.o. female presenting with back pain. The history is provided by the patient. No language interpreter was used.  Back Pain Location:  Sacro-iliac joint Quality:  Cramping Radiates to:  Does not radiate Pain severity:  Severe Pain is:  Same all the time Onset quality:  Sudden Duration:  7 hours Timing:  Intermittent Progression:  Unchanged Chronicity:  New Context: not twisting   Relieved by:  Nothing Worsened by:  Nothing tried Ineffective treatments:  None tried Associated symptoms: no weight loss   Associated symptoms comment:  No bleeding no gush of fluid Risk factors: pregnancy    HPI Comments: Brandy Mckenzie is a 20 y.o. female G1P0 at 76 weeks with EDD of Dec 15, 2015 who presents to the Emergency Department with back pain since 4 pm. Admitted to hospital when she was 4 cm dilated 5 days ago and mucus plug. 2 dilated cm on 3 days ago when discharged from hospital. Contractions started 7 hours ago but she did not contact an OB physician and simply came to the ED. She reports she was recently told she would be switched to [place] from her former provider in [facility] and is unsure of her current provider's name. She is unsure how far apart her contractions currently are.  She is currently testing on her phone   History reviewed. No pertinent past medical history. History reviewed. No pertinent past surgical history. History reviewed. No pertinent family history. Social History  Substance Use Topics  . Smoking status: Never Smoker   . Smokeless tobacco: None  . Alcohol Use: No   OB History     Gravida Para Term Preterm AB TAB SAB Ectopic Multiple Living   1              Review of Systems  Constitutional: Negative for weight loss.  Genitourinary: Negative for vaginal bleeding, vaginal discharge and vaginal pain.  Musculoskeletal: Positive for back pain.  All other systems reviewed and are negative.     Allergies  Review of patient's allergies indicates no known allergies.  Home Medications   Prior to Admission medications   Medication Sig Start Date End Date Taking? Authorizing Provider  acetaminophen (TYLENOL) 500 MG tablet Take 2 tablets (1,000 mg total) by mouth every 6 (six) hours as needed. 08/01/15  Yes Arby Barrette, MD  Prenatal Vit-Fe Fumarate-FA (PRENATAL VITAMIN PO) Take by mouth.   Yes Historical Provider, MD   BP 111/72 mmHg  Pulse 107  Temp(Src) 97.8 F (36.6 C) (Oral)  Resp 18  Ht  (1.6 m)  Wt 123 lb (55.792 kg)  BMI 21.79 kg/m2  SpO2 100%  LMP 03/22/2015 Physical Exam  Constitutional: She is oriented to person, place, and time. She appears well-developed and well-nourished. No distress.  HENT:  Head: Normocephalic and atraumatic.  Moist mucous membranes.  Eyes: Conjunctivae and EOM are normal. Pupils are equal, round, and reactive to light.  Neck: Normal range of motion. Neck supple. No tracheal deviation present.  Cardiovascular: Normal rate, regular rhythm and normal heart sounds.   Pulmonary/Chest: Effort normal. No respiratory  distress. She has no wheezes. She has no rales.  Lungs CTA.  Abdominal: Soft. There is no tenderness. There is no rebound and no guarding.  Hyperactive bowel sounds.   Genitourinary:  deferred  Musculoskeletal: Normal range of motion.  Neurological: She is alert and oriented to person, place, and time.  Skin: Skin is warm and dry.  Psychiatric: She has a normal mood and affect. Her behavior is normal.  Nursing note and vitals reviewed.   ED Course  Procedures (including critical care  time)  DIAGNOSTIC STUDIES: Oxygen Saturation is 100% on RA, normal by my interpretation.    COORDINATION OF CARE: 11:09 PM Discussed treatment plan with Brandy Mckenzie at bedside and Brandy Mckenzie agreed to plan.   Labs Review Labs Reviewed  URINALYSIS, ROUTINE W REFLEX MICROSCOPIC (NOT AT [facility])    Imaging Review No results found. I have personally reviewed and evaluated these images and lab results as part of my medical decision-making.   EKG Interpretation None      MDM   Final diagnoses:  None   Medications  sodium chloride 0.9 % bolus 1,000 mL (not administered)  terbutaline (BRETHINE) injection 0.25 mg (0.25 mg Subcutaneous Given 10/21/15 2332)  lactated ringers bolus 1,000 mL (0 mLs Intravenous Stopped 10/21/15 2346)  terbutaline (BRETHINE) injection 0.25 mg (0.25 mg Subcutaneous Given 10/21/15 2315)  sodium chloride 0.9 % bolus 1,000 mL (1,000 mLs Intravenous New Bag/Given 10/21/15 2344)  acetaminophen (TYLENOL) tablet 650 mg (650 mg Oral Given 10/22/15 0128)   We do not have calcium channel blocker recommended by OB rapid response by phone  Is not yet under the care of a [facility] physician.    Case d/w Dr. Vicente SereneAyanwu who will accept patient to the [facility]   I personally performed the services described in this documentation, which was scribed in my presence. The recorded information has been reviewed and is accurate.     Cy BlamerApril Lada Fulbright, MD 10/22/15 [phone]

## 2015-10-21 NOTE — Progress Notes (Signed)
ED provider speaking with OB attending about plan of care; [facility] does not have the dose of procardia ordered; med changed to terbutaline

## 2015-10-21 NOTE — ED Notes (Signed)
This note also relates to the following rows which could not be included: Resp - Cannot attach notes to unvalidated device data   RROB called Christy, [facility] at [facility], told of fhr decelling and needs to change pt's position and adjust monitor

## 2015-10-21 NOTE — ED Notes (Signed)
[facility]

## 2015-10-21 NOTE — ED Notes (Signed)
Per EDP instructions -- pt placed on L side in trendelenburg.

## 2015-10-21 NOTE — ED Notes (Signed)
Pt repositioned to R side per recommendation of RRN. Monitor adjusted; pt denies pain and/or pressure at this time.

## 2015-10-22 ENCOUNTER — Encounter (HOSPITAL_COMMUNITY): Payer: Self-pay | Admitting: *Deleted

## 2015-10-22 DIAGNOSIS — O9989 Other specified diseases and conditions complicating pregnancy, childbirth and the puerperium: Secondary | ICD-10-CM

## 2015-10-22 DIAGNOSIS — R109 Unspecified abdominal pain: Secondary | ICD-10-CM

## 2015-10-22 LAB — URINALYSIS, ROUTINE W REFLEX MICROSCOPIC
BILIRUBIN URINE: NEGATIVE
GLUCOSE, UA: 250 mg/dL — AB
HGB URINE DIPSTICK: NEGATIVE
Ketones, ur: NEGATIVE mg/dL
Nitrite: NEGATIVE
PH: 6.5 (ref 5.0–8.0)
Protein, ur: NEGATIVE mg/dL
SPECIFIC GRAVITY, URINE: 1.01 (ref 1.005–1.030)

## 2015-10-22 LAB — CBC WITH DIFFERENTIAL/PLATELET
Basophils Absolute: 0 10*3/uL (ref 0.0–0.1)
Basophils Relative: 0 %
Eosinophils Absolute: 0.2 10*3/uL (ref 0.0–0.7)
Eosinophils Relative: 1 %
HCT: 33.6 % — ABNORMAL LOW (ref 36.0–46.0)
Hemoglobin: 10.4 g/dL — ABNORMAL LOW (ref 12.0–15.0)
Lymphocytes Relative: 16 %
Lymphs Abs: 2.7 10*3/uL (ref 0.7–4.0)
MCH: 26.9 pg (ref 26.0–34.0)
MCHC: 31 g/dL (ref 30.0–36.0)
MCV: 87 fL (ref 78.0–100.0)
MONO ABS: 2 10*3/uL — AB (ref 0.1–1.0)
MONOS PCT: 12 %
NEUTROS PCT: 71 %
Neutro Abs: 11.7 10*3/uL — ABNORMAL HIGH (ref 1.7–7.7)
PLATELETS: 191 10*3/uL (ref 150–400)
RBC: 3.86 MIL/uL — AB (ref 3.87–5.11)
RDW: 14.3 % (ref 11.5–15.5)
WBC: 16.6 10*3/uL — AB (ref 4.0–10.5)

## 2015-10-22 LAB — URINE MICROSCOPIC-ADD ON: RBC / HPF: NONE SEEN RBC/hpf (ref 0–5)

## 2015-10-22 LAB — GLUCOSE, CAPILLARY: GLUCOSE-CAPILLARY: 108 mg/dL — AB (ref 65–99)

## 2015-10-22 MED ORDER — ACETAMINOPHEN 325 MG PO TABS
650.0000 mg | ORAL_TABLET | Freq: Once | ORAL | Status: AC
  Administered 2015-10-22: 650 mg via ORAL
  Filled 2015-10-22: qty 2

## 2015-10-22 NOTE — Progress Notes (Signed)
OK to d/c EFM per Philipp DeputyKim SHaw CNM

## 2015-10-22 NOTE — MAU Note (Signed)
Pt has IV L AC on admission. Site clean and dry and infusing well at 100cc/hr

## 2015-10-22 NOTE — Progress Notes (Signed)
Dr [place] on unit and aware of pt;'s admission and status. Will see pt

## 2015-10-22 NOTE — MAU Provider Note (Signed)
History     CSN: [id number]  Arrival date and time: 10/21/15 2207   None     Chief Complaint  Patient presents with  . ?labor 8 months preg    HPI Brandy Mckenzie is a 20yo G1 @ 30.4wks who presents from [facility] via EMS for further eval of abd cramping. Denies leaking or bldg. Also reports having a H/A. No fever or N/V/D. [facility] but is in the process [facility] as she will be needing "ultrasounds twice a week there anyway." Pt is not aware of why she requires twice weekly visits, although reports having a cx exam of 2cm last week when she was being [facility]. Pt also reports eating sugar directly from the bag daily- "craves it."  OB History    Gravida Para Term Preterm AB TAB SAB Ectopic Multiple Living   1               Past Medical History  Diagnosis Date  . Medical history non-contributory     Past Surgical History  Procedure Laterality Date  . No past surgeries      History reviewed. No pertinent family history.  Social History  Substance Use Topics  . Smoking status: Former Smoker    Quit date: 05/24/2015  . Smokeless tobacco: None  . Alcohol Use: No    Allergies: No Known Allergies  Prescriptions prior to admission  Medication Sig Dispense Refill Last Dose  . Prenatal Vit-Fe Fumarate-FA (PRENATAL VITAMIN PO) Take by mouth.   10/21/2015 at Unknown time  . acetaminophen (TYLENOL) 500 MG tablet Take 2 tablets (1,000 mg total) by mouth every 6 (six) hours as needed. 30 tablet 0 More than a month at Unknown time    ROS No other pertinents other than what is listed in HPI Physical Exam   Blood pressure 118/73, pulse 115, temperature 98.3 F (36.8 C), temperature source Oral, resp. rate 18, height 5\' 3"  (1.6 m), weight 55.792 kg (123 lb), last menstrual period 03/22/2015, SpO2 100 %.  Physical Exam  Constitutional: She is oriented to person, place, and time. She appears  well-developed.  HENT:  Head: Normocephalic.  Neck: Normal range of motion.  Cardiovascular:  Sl tachycardic  Respiratory: Effort normal.  GI:  EFM 140s, +accels, no decels No ctx per toco  Genitourinary: Vagina normal.  Cx 1.5/thick/vtx-2  Musculoskeletal: Normal range of motion.  Neurological: She is alert and oriented to person, place, and time.  Skin: Skin is warm and dry.  Psychiatric: She has a normal mood and affect. Her behavior is normal. Thought content normal.   Urinalysis    Component Value Date/Time   COLORURINE YELLOW 10/22/2015 0020   APPEARANCEUR CLEAR 10/22/2015 0020   LABSPEC 1.010 10/22/2015 0020   PHURINE 6.5 10/22/2015 0020   GLUCOSEU 250* 10/22/2015 0020   HGBUR NEGATIVE 10/22/2015 0020   BILIRUBINUR NEGATIVE 10/22/2015 0020   KETONESUR NEGATIVE 10/22/2015 0020   PROTEINUR NEGATIVE 10/22/2015 0020   UROBILINOGEN 1.0 06/19/2015 1700   NITRITE NEGATIVE 10/22/2015 0020   LEUKOCYTESUR SMALL* 10/22/2015 0020  Micro: 0-5 SE, few bacteria  glucose from BMP @ [facility]: 87  CBG: 108mg /dl  MAU Course  Procedures  MDM Cx exam NST read UA/CBG ordered Tylenol given  Assessment and Plan  IUP@30 .4wks Abd cramping H/A  Will d/c home w/ preterm labor precautions Call previous OB provider on Monday to determine when  and where next f/u appt is to be   Cam Hai CNM 10/22/2015, 12:48 AM

## 2015-10-22 NOTE — MAU Note (Signed)
[facility]. Pt stated she is not having andy pain or cramping at this time. V/SS

## 2015-10-22 NOTE — Progress Notes (Signed)
Brandy DeputyKim Mckenzie CNM

## 2015-10-22 NOTE — Progress Notes (Signed)
Philipp DeputyKim Shaw CNM in earlier and discussed d/c plan. Written and verbal d/c instructions given and understanding voice

## 2015-10-22 NOTE — MAU Note (Signed)
Pt [facility]. Pt alert and oriented. Pt [facility] to [facility] due to headaches and lower abd pain

## 2015-10-22 NOTE — MAU Note (Signed)
IV NS d/ced for L AC. IV started at [facility]

## 2015-10-22 NOTE — Discharge Instructions (Signed)

## 2016-08-25 ENCOUNTER — Encounter (HOSPITAL_COMMUNITY): Payer: Self-pay

## 2017-08-02 IMAGING — US US RENAL
1 series · 14 of 18 positions shown · non-contrast
Comparison: None.

CLINICAL DATA: Left flank and left lower quadrant abdominal pain.
Patient is 14 weeks pregnant.

EXAM:
RENAL / URINARY TRACT ULTRASOUND COMPLETE

[Series 1: us renal · 0.17mm/px · 14 of 18 slices shown]
[im 1/18]
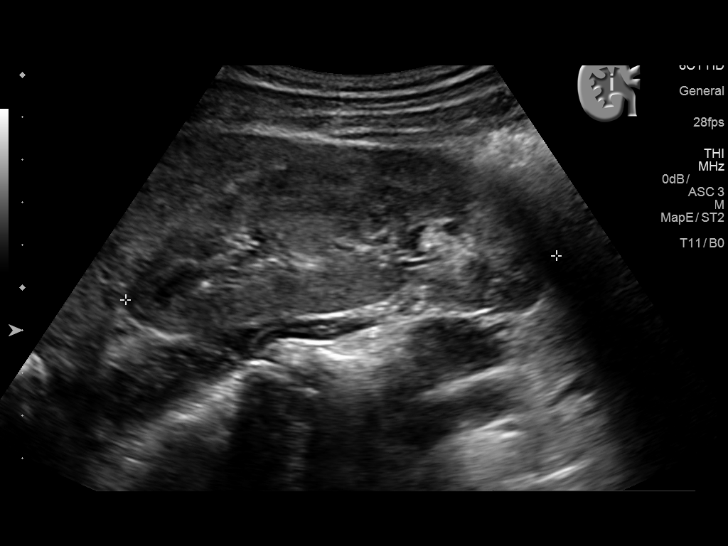
[im 2/18]
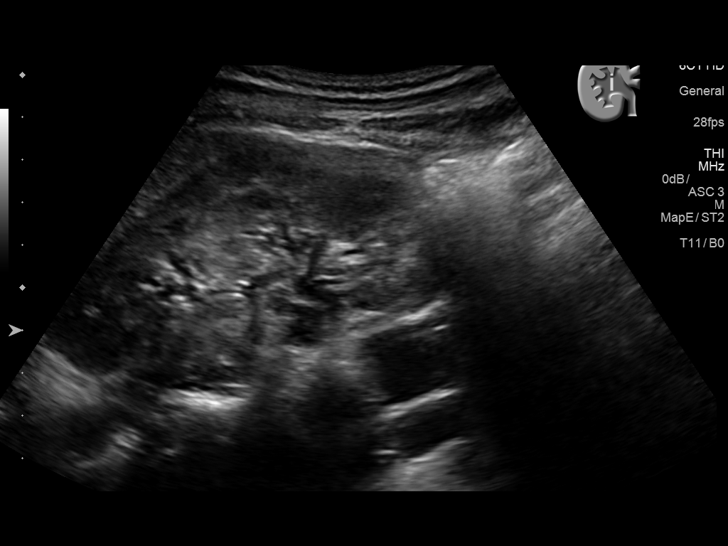
[im 4/18]
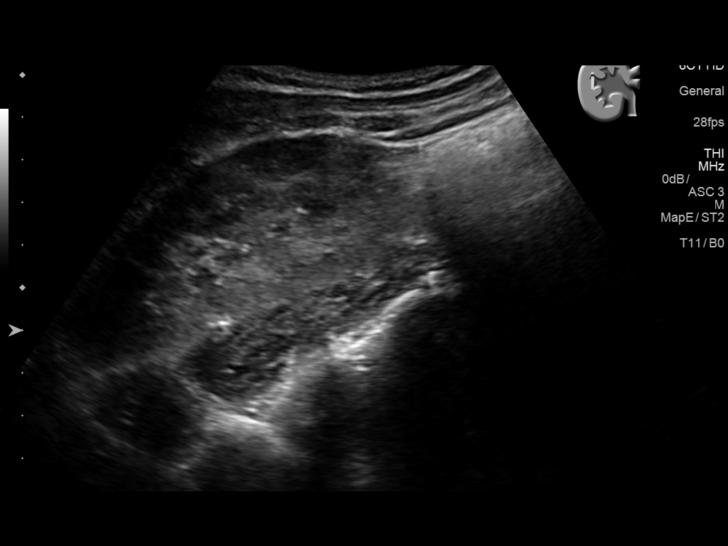
[im 5/18]
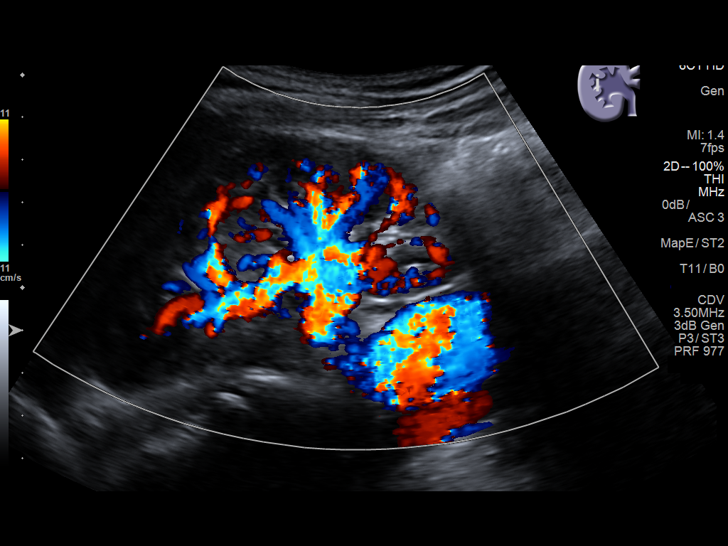
[im 6/18]
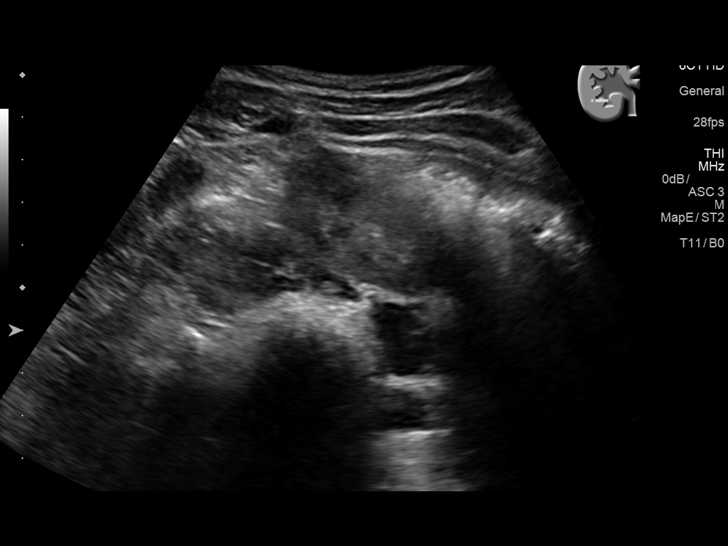
[im 8/18]
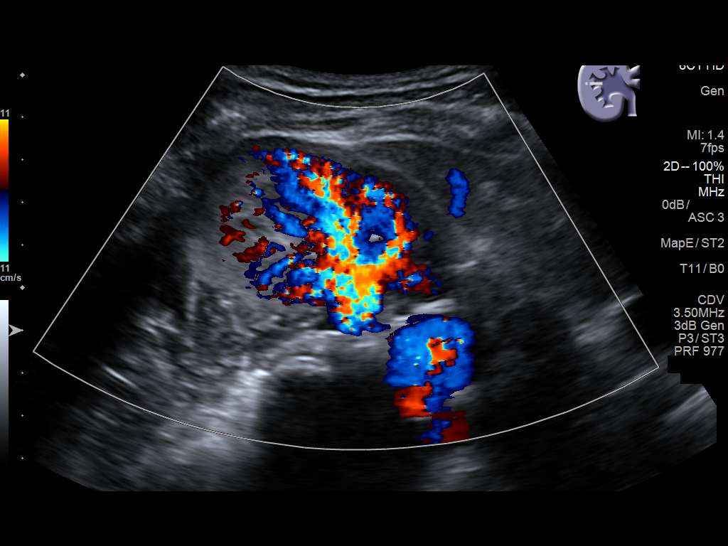
[im 9/18]
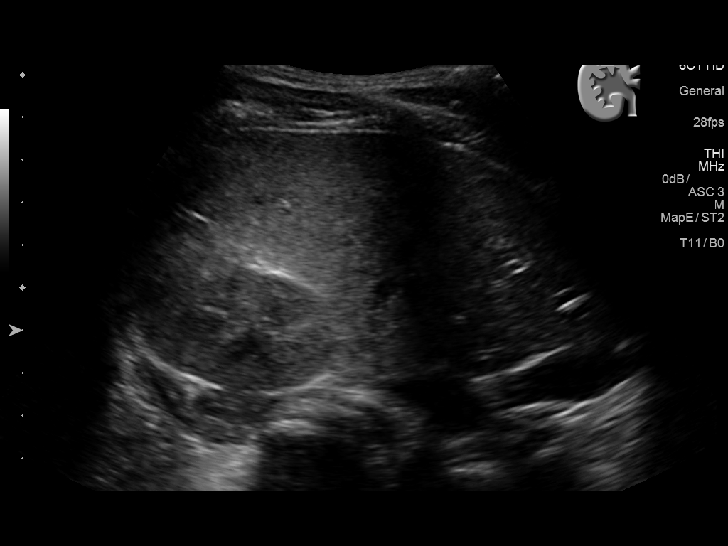
[im 10/18]
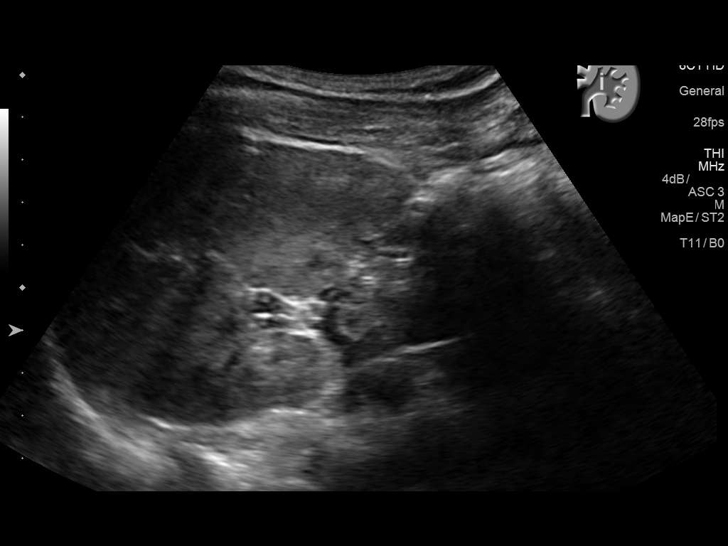
[im 11/18]
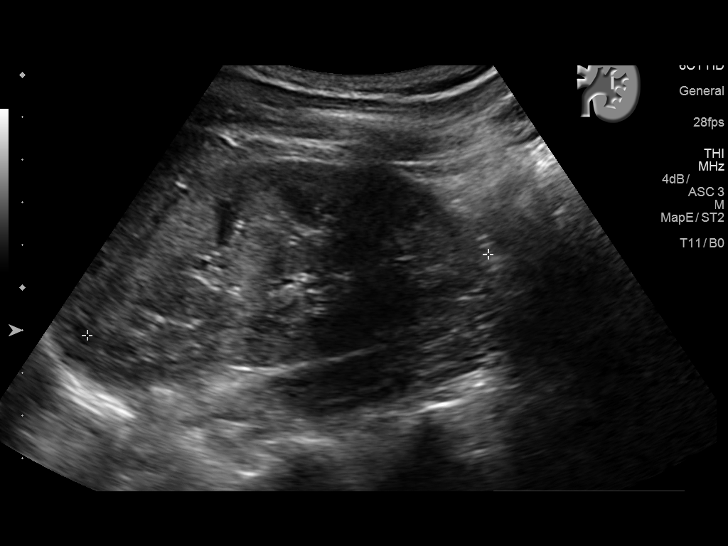
[im 13/18]
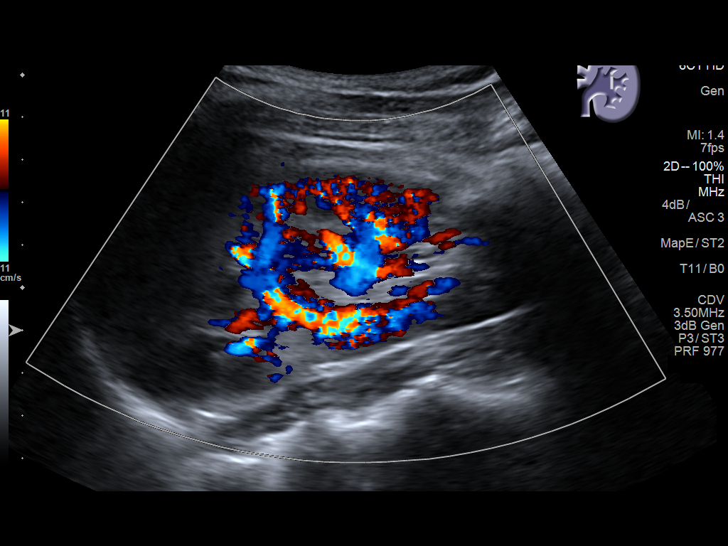
[im 14/18]
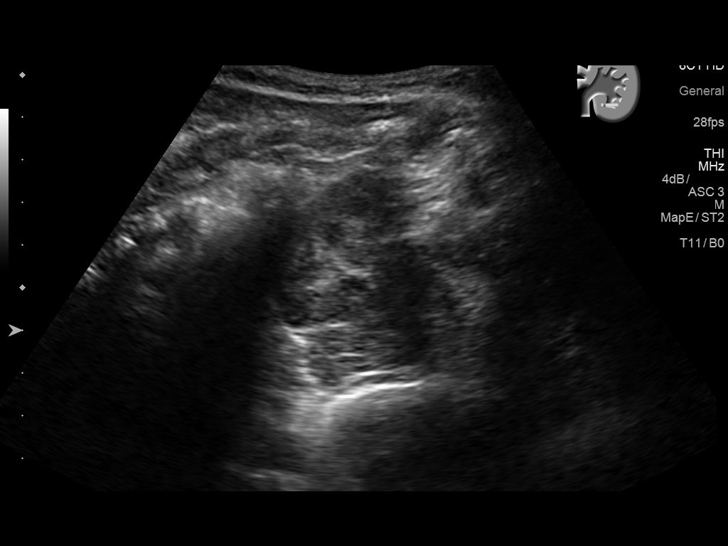
[im 15/18]
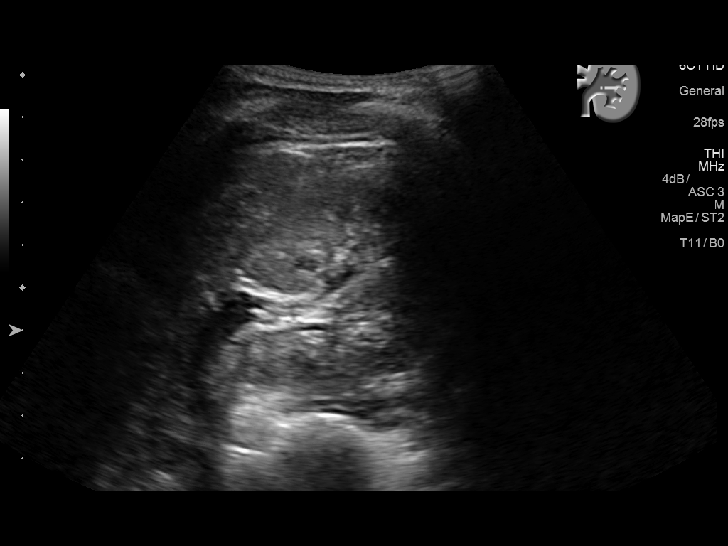
[im 17/18]
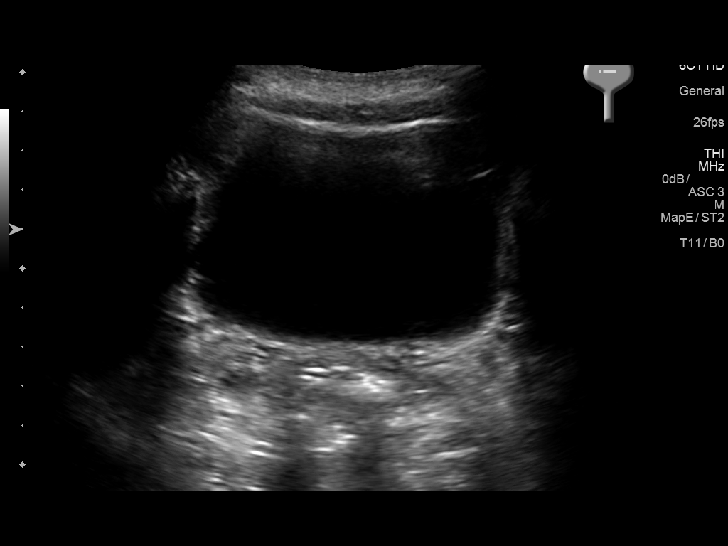
[im 18/18]
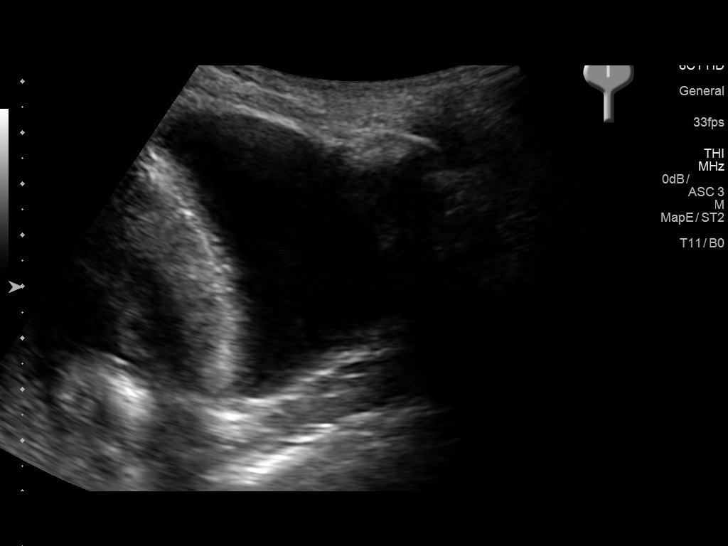

[14 of 18 positions shown; findings below may reference images not displayed]

FINDINGS: Right Kidney:

Length: 10.2 cm. Normal renal cortical thickness and echogenicity
without focal lesions or hydronephrosis. No renal calculi.

Left Kidney:

Length: 9.6 cm. Normal renal cortical thickness and echogenicity
without focal lesions or hydronephrosis. No renal calculi.

Bladder:

Normal.
IMPRESSION: Normal renal ultrasound examination.

## 2017-08-02 IMAGING — US US ART/VEN ABD/PELV/SCROTUM DOPPLER LTD
1 series · 13 of 23 positions shown · non-contrast
Comparison: none

CLINICAL DATA: Acute onset of left pelvic and flank pain. Initial
encounter.

EXAM:
LIMITED OBSTETRIC ULTRASOUND AND DOPPLER

[Series 1: us art/ven abd/pelv/scrotum doppler ltd · 0.21mm/px · 23 acquisitions, 13 frames shown]
[im 1/23]
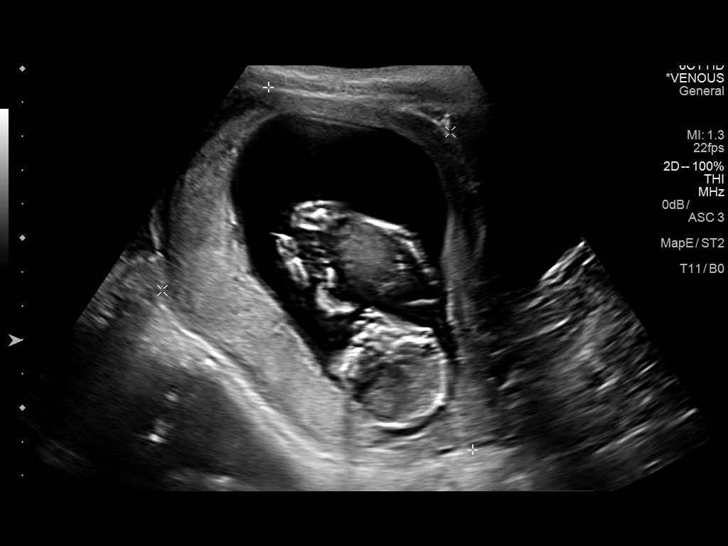
[im 3/23]
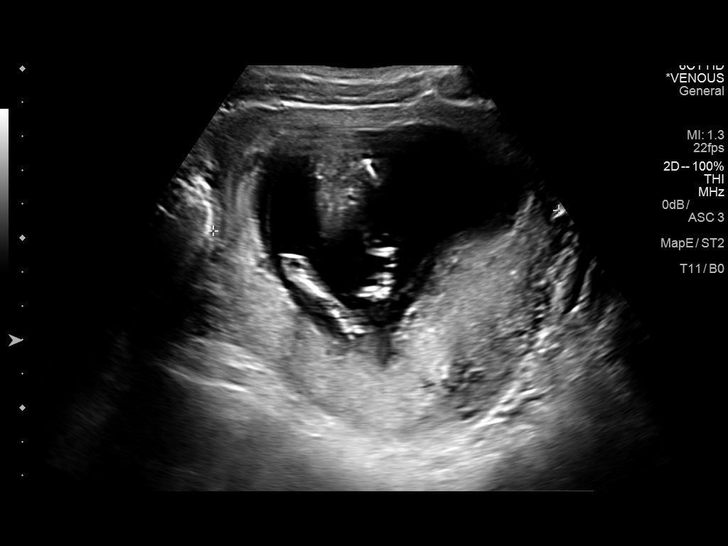
[im 5/23]
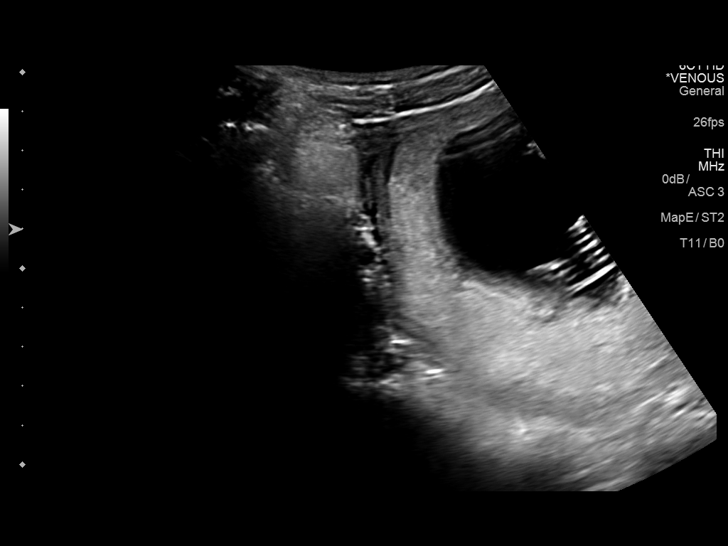
[im 7/23]
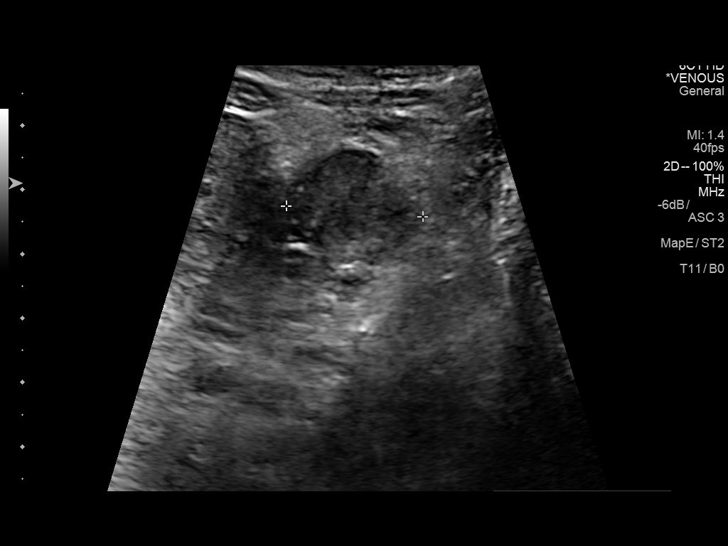
[im 8/23]
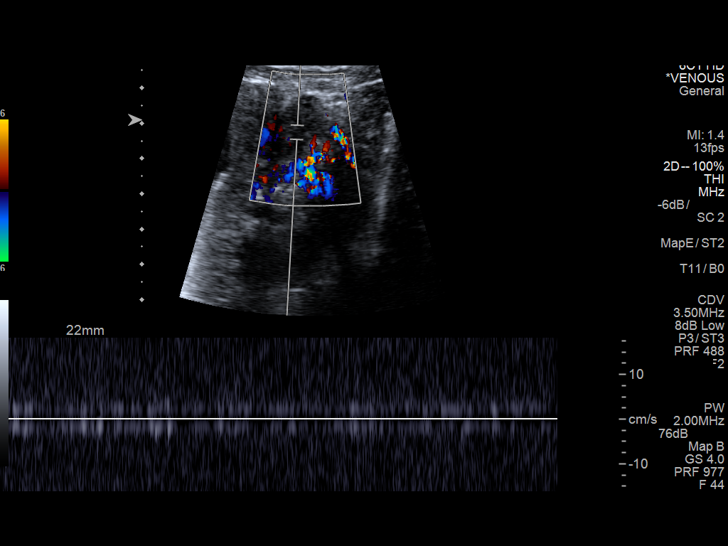
[im 10/23]
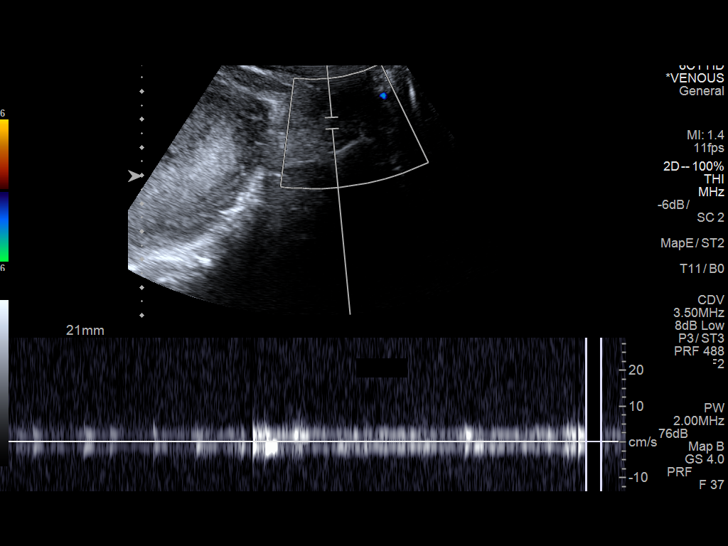
[im 12/23]
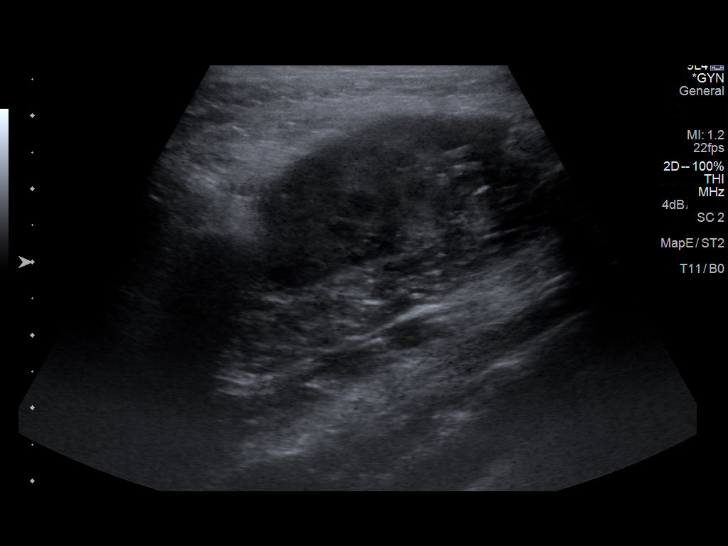
[im 14/23]
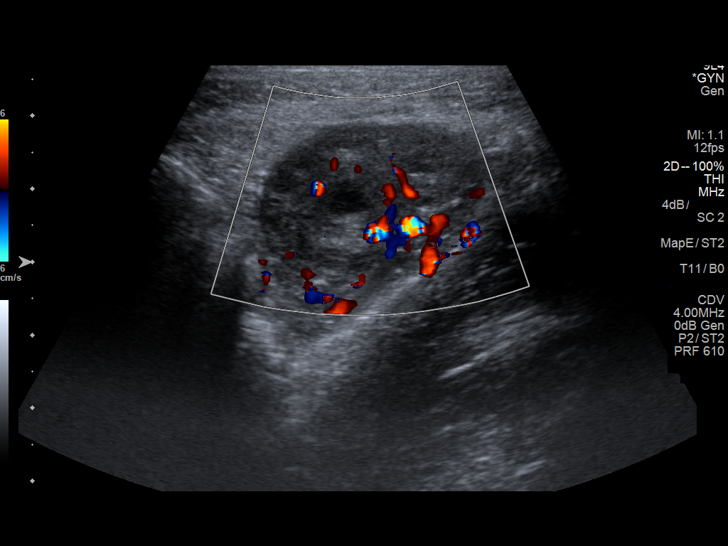
[im 16/23]
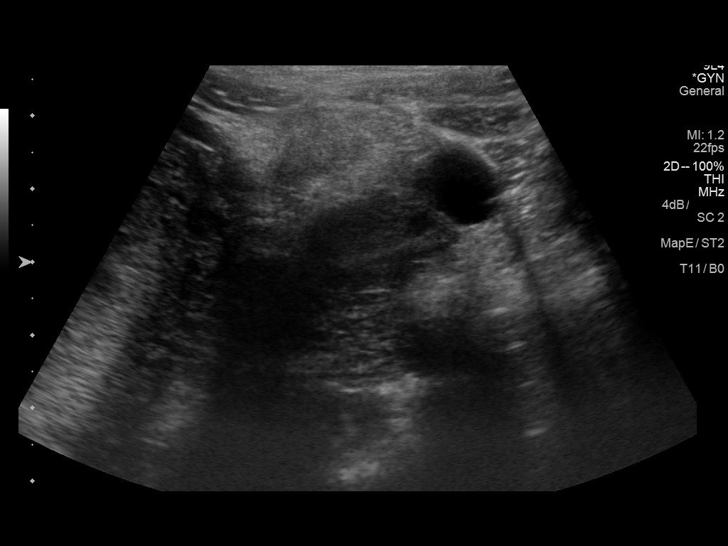
[im 17/23]
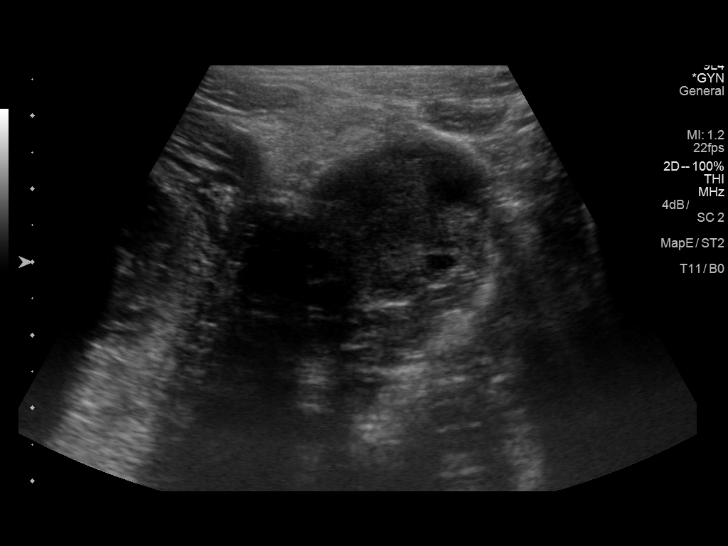
[im 19/23]
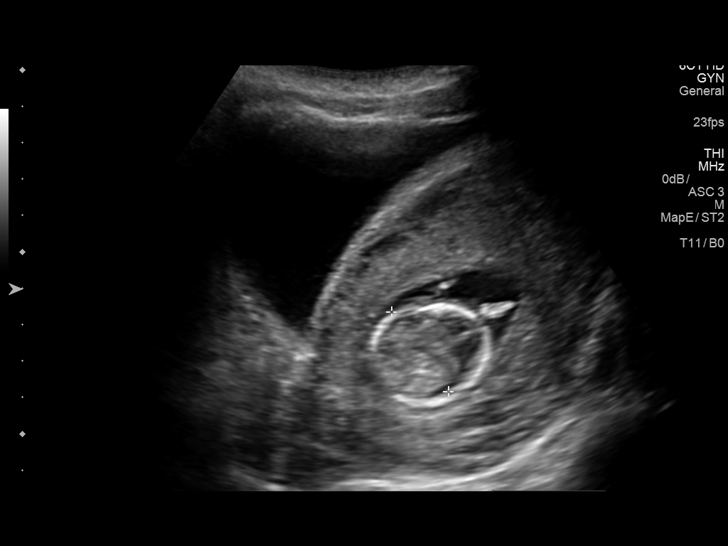
[im 21/23]
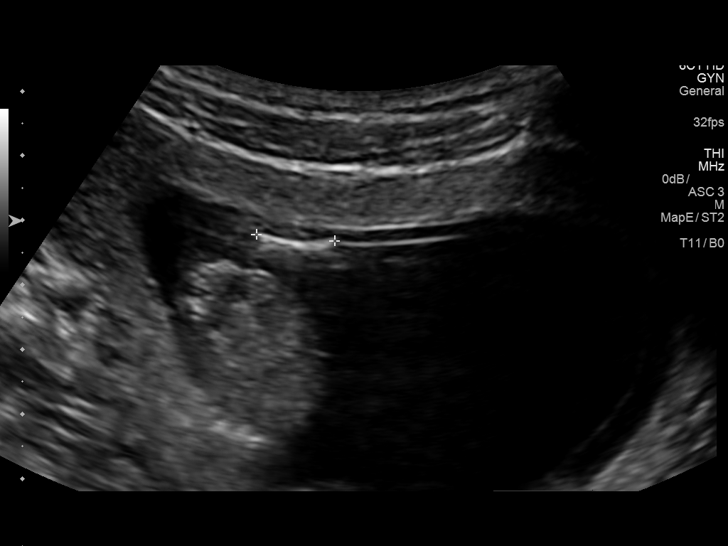
[im 23/23]
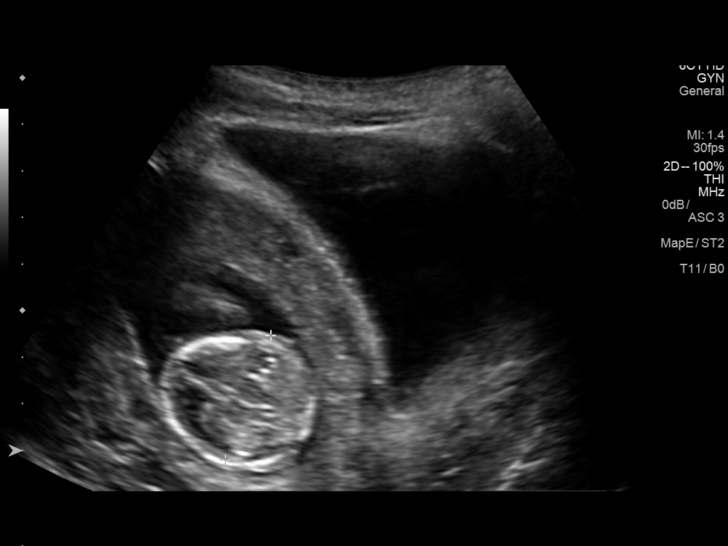

[13 of 23 positions shown; findings below may reference images not displayed]

FINDINGS: Number of Fetuses: 1

Heart Rate:  144 bpm

Movement: Yes

Presentation: Cephalic

Placental Location: Posterior

Previa: No

Amniotic Fluid (Subjective):  Within normal limits.

BPD:  2.8 cm    15 w 0 d

MATERNAL FINDINGS:

Cervix:  Appears closed.

Uterus/Adnexae: No abnormality visualized.

Pulsed Doppler evaluation of the left ovary demonstrates normal
low-resistance arterial and venous waveforms. The right ovary is not
visualized on this study.

Other findings

No free fluid is seen within the pelvic cul-de-sac.
IMPRESSION: 1. No evidence for ovarian torsion. The left ovary is unremarkable
in appearance, and demonstrates normal arterial and venous
waveforms. The right ovary is not visualized on this study.
2. Single live intrauterine pregnancy noted, with a biparietal
diameter of 2.8 cm, corresponding to a gestational age of 15 weeks 0
days. This matches the gestational age of 14 weeks 3 days by LMP,
reflecting an estimated date of delivery December 15, 2015.
3. Cervix remains closed.  No evidence of placenta previa.

This exam is performed on an emergent basis and does not
comprehensively evaluate fetal size, dating, or anatomy; follow-up
complete OB US should be considered if further fetal assessment is
warranted.

## 2018-02-04 ENCOUNTER — Other Ambulatory Visit: Payer: Self-pay

## 2018-02-04 ENCOUNTER — Emergency Department (HOSPITAL_BASED_OUTPATIENT_CLINIC_OR_DEPARTMENT_OTHER)
Admission: EM | Admit: 2018-02-04 | Discharge: 2018-02-04 | Disposition: A | Discharge status: Home / Self Care | Payer: Self-pay | Attending: Emergency Medicine | Admitting: Emergency Medicine

## 2018-02-04 ENCOUNTER — Encounter (HOSPITAL_BASED_OUTPATIENT_CLINIC_OR_DEPARTMENT_OTHER): Payer: Self-pay | Admitting: Emergency Medicine

## 2018-02-04 ENCOUNTER — Emergency Department (HOSPITAL_BASED_OUTPATIENT_CLINIC_OR_DEPARTMENT_OTHER): Payer: Self-pay

## 2018-02-04 DIAGNOSIS — Z87891 Personal history of nicotine dependence: Secondary | ICD-10-CM | POA: Insufficient documentation

## 2018-02-04 DIAGNOSIS — W231XXA Caught, crushed, jammed, or pinched between stationary objects, initial encounter: Secondary | ICD-10-CM | POA: Insufficient documentation

## 2018-02-04 DIAGNOSIS — Y998 Other external cause status: Secondary | ICD-10-CM | POA: Insufficient documentation

## 2018-02-04 DIAGNOSIS — Y9389 Activity, other specified: Secondary | ICD-10-CM | POA: Insufficient documentation

## 2018-02-04 DIAGNOSIS — S60222A Contusion of left hand, initial encounter: Secondary | ICD-10-CM | POA: Insufficient documentation

## 2018-02-04 DIAGNOSIS — Y92013 Bedroom of single-family (private) house as the place of occurrence of the external cause: Secondary | ICD-10-CM | POA: Insufficient documentation

## 2018-02-04 MED ORDER — IBUPROFEN 800 MG PO TABS: 800.0000 mg | ORAL_TABLET | Freq: Three times a day (TID) | ORAL | 0 refills | Status: AC | PRN

## 2018-02-04 MED ORDER — IBUPROFEN 800 MG PO TABS
800.0000 mg | ORAL_TABLET | Freq: Once | ORAL | Status: AC
  Administered 2018-02-04: 800 mg via ORAL
  Filled 2018-02-04: qty 1

## 2018-02-04 NOTE — Discharge Instructions (Addendum)
You can wear the wrist splint for comfort.  Keep your hand elevated for the next day.  You can apply ice for pain/swelling for the next day.

## 2018-02-04 NOTE — ED Notes (Signed)
Patient transported to X-ray 

## 2018-02-04 NOTE — ED Provider Notes (Signed)
[facility] EMERGENCY DEPARTMENT Provider Note   CSN: [id number] Arrival date & time: 02/04/18  9528     History   Chief Complaint Chief Complaint  Patient presents with  . Hand Injury    HPI Brandy Mckenzie is a 22 y.o. female.  The history is provided by the patient. No language interpreter was used.  Hand Injury     Brandy Mckenzie is a 22 y.o. female who presents to the Emergency Department complaining of hand injury. He is left-hand dominant and around midnight when she was cleaning under her toddler's bed the bed fell onto her left hand. It was a direct blow to the left dorsal hand and she reports immediate pain. She has pain to the dorsal hand and pain with flexion of the thumb and wrist. No numbness or weakness. She works in a [employer], [employer]. She has no medical problems and takes no medications. Symptoms are moderate and constant nature. Past Medical History:  Diagnosis Date  . Medical history non-contributory     There are no active problems to display for this patient.   Past Surgical History:  Procedure Laterality Date  . NO PAST SURGERIES       OB History    Gravida  1   Para      Term      Preterm      AB      Living        SAB      TAB      Ectopic      Multiple      Live Births               Home Medications    Prior to Admission medications   Medication Sig Start Date End Date Taking? Authorizing Provider  ibuprofen (ADVIL,MOTRIN) 800 MG tablet Take 1 tablet (800 mg total) by mouth every 8 (eight) hours as needed. 02/04/18   Tilden Fossa, MD    Family History No family history on file.  Social History Social History   Tobacco Use  . Smoking status: Former Smoker    Last attempt to quit: 05/24/2015    Years since quitting: 2.7  Substance Use Topics  . Alcohol use: No  . Drug use: No     Allergies   Patient has no known allergies.   Review of Systems Review of Systems  All other systems reviewed and are  negative.    Physical Exam Updated Vital Signs BP 124/87 (BP Location: Right Arm)   Pulse 74   Temp 98.3 F (36.8 C) (Oral)   Resp 16   Ht 5\' 3"  (1.6 m)   Wt 66 kg (145 lb 7 oz)   SpO2 100%   BMI 25.76 kg/m   Physical Exam  Constitutional: She is oriented to person, place, and time. She appears well-developed and well-nourished. No distress.  HENT:  Head: Normocephalic and atraumatic.  Neck: Neck supple.  Cardiovascular: Normal rate and regular rhythm.  Pulmonary/Chest: Effort normal. No respiratory distress.  Musculoskeletal:  2+ radial pulses bilaterally. There is tenderness and mild swelling to the left dorsal hand. There is no snuff box tenderness to palpation. There is pain with adduction of the thumb with range of motion intact. Flexion/extension at the wrist is intact.  Neurological: She is alert and oriented to person, place, and time.  Sensation to light touch intact throughout bilateral hands.  Skin: Skin is warm and dry. Capillary refill takes less than 2 seconds.  Psychiatric: She has a normal mood and affect. Her behavior is normal.  Nursing note and vitals reviewed.    ED Treatments / Results  Labs (all labs ordered are listed, but only abnormal results are displayed) Labs Reviewed - No data to display  EKG None  Radiology Dg Wrist Complete Left  Result Date: 02/04/2018 CLINICAL DATA:  Posttraumatic left hand and wrist pain. Initial encounter. EXAM: LEFT WRIST - COMPLETE 3+ VIEW COMPARISON:  None. FINDINGS: There is no evidence of fracture or dislocation. There is no evidence of arthropathy or other focal bone abnormality. Soft tissues are unremarkable. IMPRESSION: Negative. Electronically Signed   By: Marnee SpringJonathon  Watts M.D.   On: 02/04/2018 07:19   Dg Hand Complete Left  Result Date: 02/04/2018 CLINICAL DATA:  Posttraumatic left hand and wrist pain. Initial encounter. EXAM: LEFT HAND - COMPLETE 3+ VIEW COMPARISON:  None. FINDINGS: There is no evidence of  fracture or dislocation. There is no evidence of arthropathy or other focal bone abnormality. Soft tissues are unremarkable. IMPRESSION: Negative. Electronically Signed   By: Marnee SpringJonathon  Watts M.D.   On: 02/04/2018 07:20    Procedures Procedures (including critical care time)  Medications Ordered in ED Medications  ibuprofen (ADVIL,MOTRIN) tablet 800 mg (has no administration in time range)     Initial Impression / Assessment and Plan / ED Course  I have reviewed the triage vital signs and the nursing notes.  Pertinent labs & imaging results that were available during my care of the patient were reviewed by me and considered in my medical decision making (see chart for details).    patient here for evaluation of left hand pain after having a bad fall on it. She does have dorsal hand tenderness with preserved range of motion. No evidence of acute fracture or dislocation. She was placed in a removable splint for comfort. Discussed home care with ice, elevation, ibuprofen. Discussed outpatient follow-up of persistent pain.   Final Clinical Impressions(s) / ED Diagnoses   Final diagnoses:  Contusion of left hand, initial encounter    ED Discharge Orders        Ordered    ibuprofen (ADVIL,MOTRIN) 800 MG tablet  Every 8 hours PRN     02/04/18 0728       Tilden Fossaees, Malynda Smolinski, MD 02/04/18 [phone]

## 2018-02-04 NOTE — ED Triage Notes (Signed)
Pt reports a bed fell on her left hand and wrist last night.

## 2019-07-03 IMAGING — DX DG WRIST COMPLETE 3+V*L*
4 series · 4 of 4 positions shown · non-contrast
Comparison: None.

CLINICAL DATA: Posttraumatic left hand and wrist pain. Initial
encounter.

EXAM:
LEFT WRIST - COMPLETE 3+ VIEW

[wrist pa]
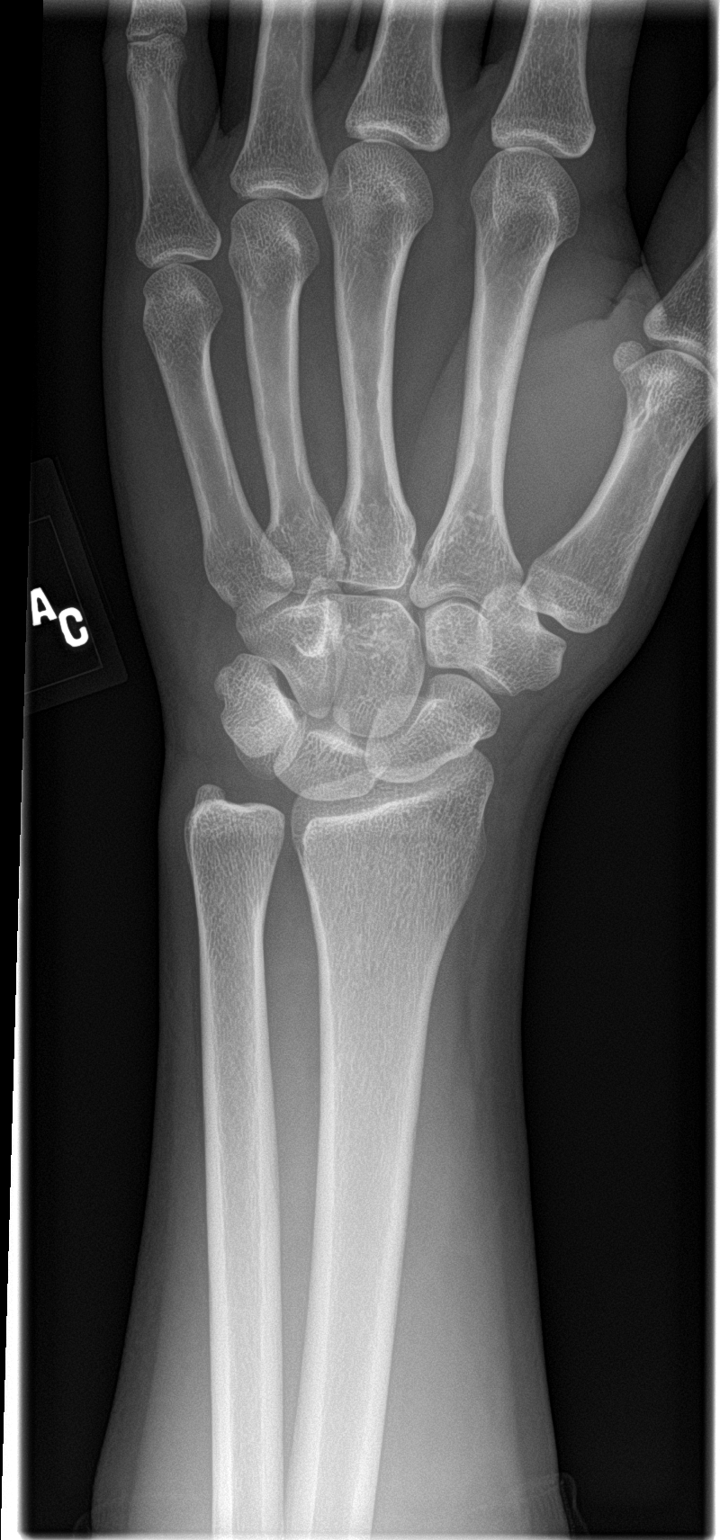

[wrist obl]
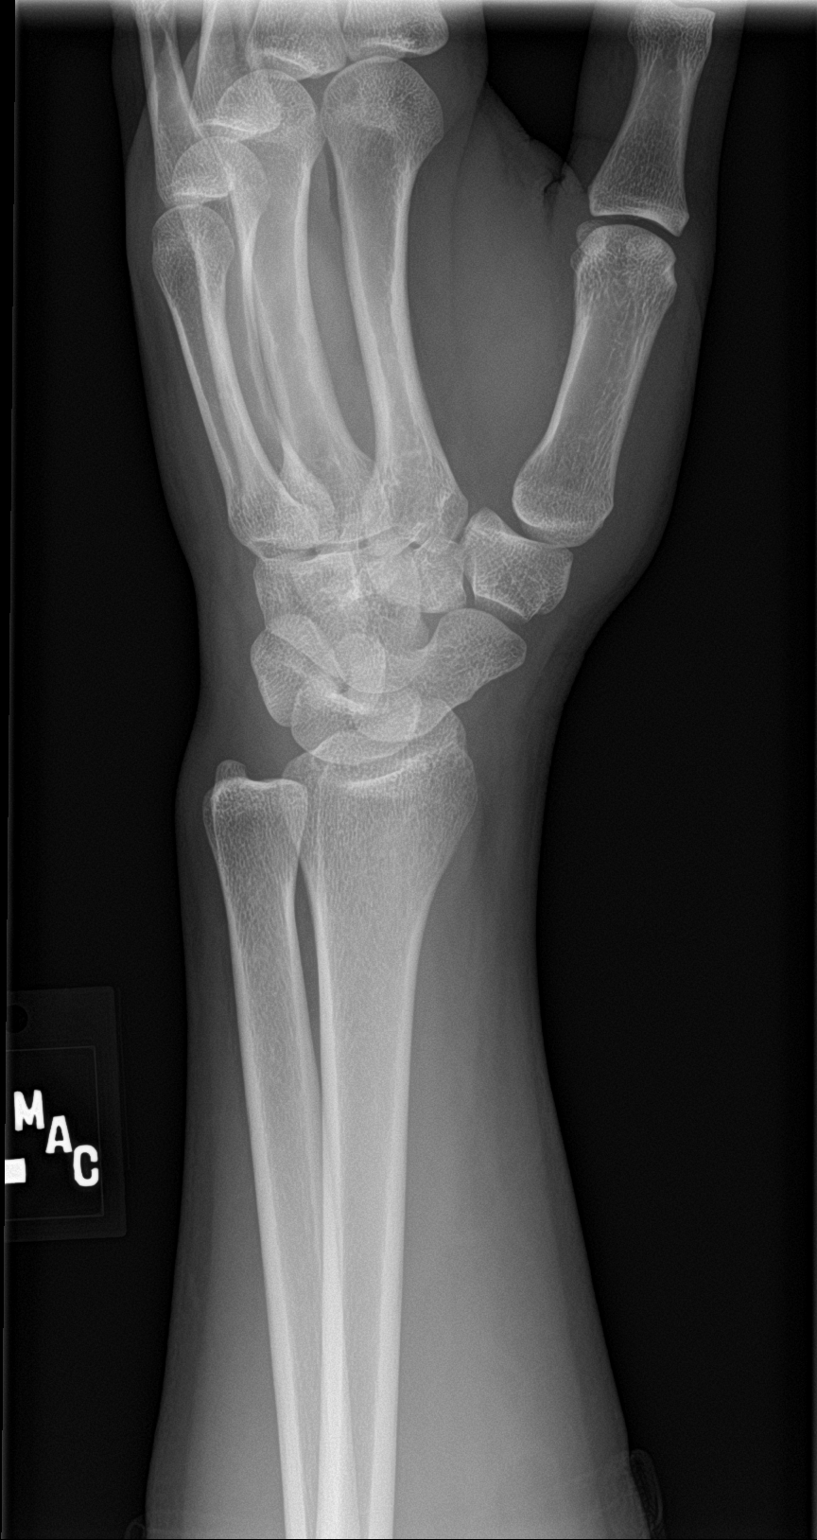

[wrist lat]
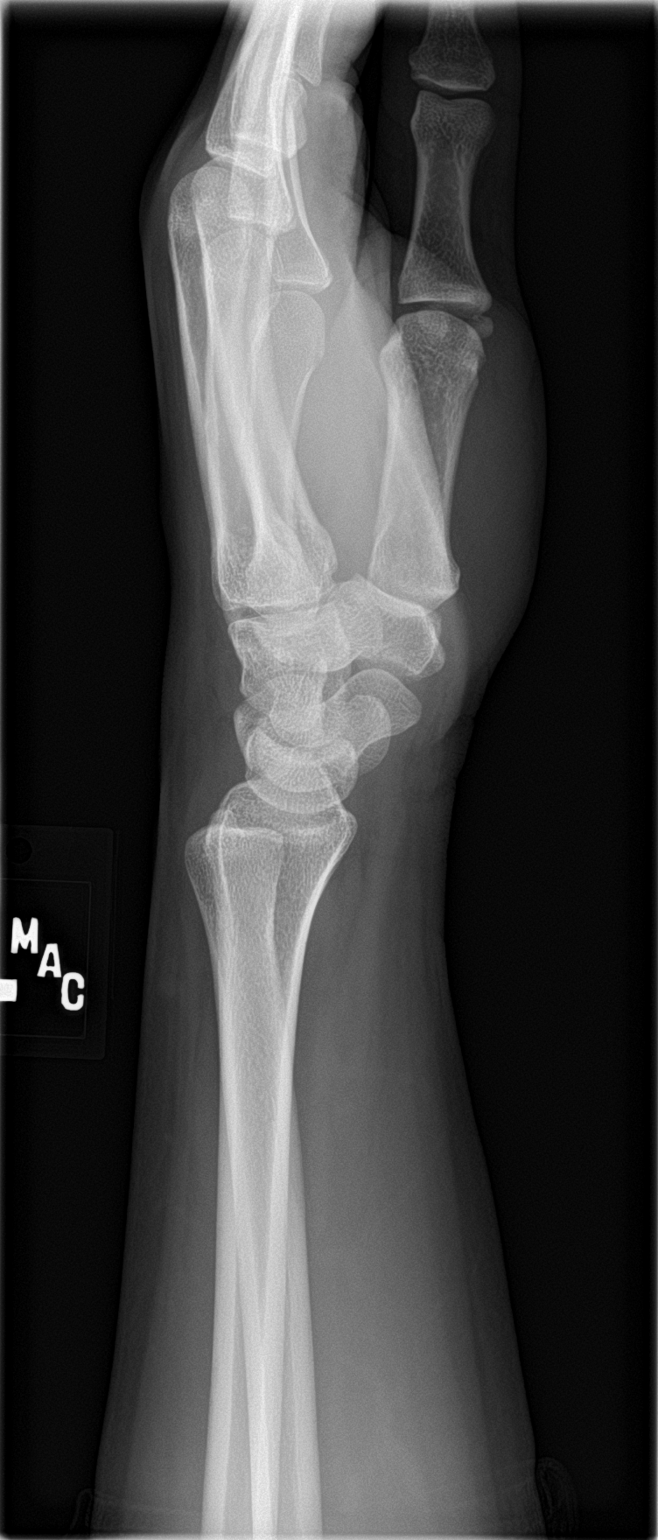

[wrist navicular]
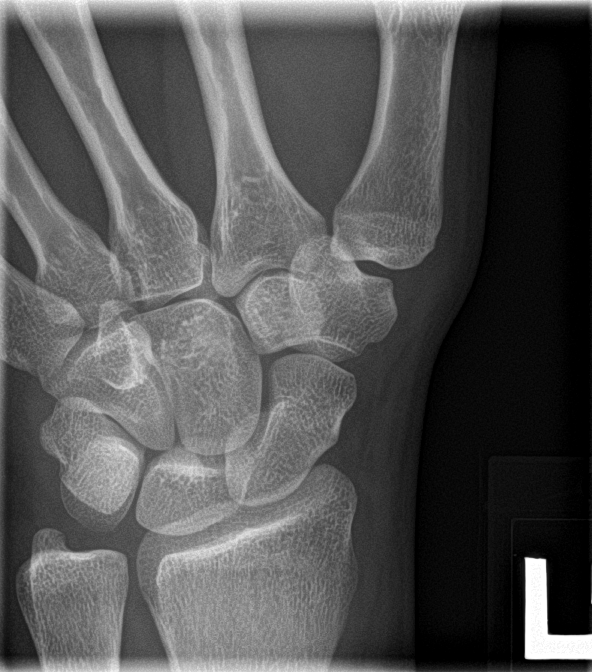

[4 of 4 positions shown; findings below may reference images not displayed]

FINDINGS: There is no evidence of fracture or dislocation. There is no
evidence of arthropathy or other focal bone abnormality. Soft
tissues are unremarkable.
IMPRESSION: Negative.

## 2020-07-31 ENCOUNTER — Encounter (HOSPITAL_BASED_OUTPATIENT_CLINIC_OR_DEPARTMENT_OTHER): Payer: Self-pay | Admitting: *Deleted

## 2020-07-31 ENCOUNTER — Emergency Department (HOSPITAL_BASED_OUTPATIENT_CLINIC_OR_DEPARTMENT_OTHER): Payer: Self-pay

## 2020-07-31 ENCOUNTER — Emergency Department (HOSPITAL_BASED_OUTPATIENT_CLINIC_OR_DEPARTMENT_OTHER)
Admission: EM | Admit: 2020-07-31 | Discharge: 2020-07-31 | Disposition: A | Discharge status: Home / Self Care | Payer: Self-pay | Attending: Emergency Medicine | Admitting: Emergency Medicine

## 2020-07-31 ENCOUNTER — Other Ambulatory Visit: Payer: Self-pay

## 2020-07-31 DIAGNOSIS — R0781 Pleurodynia: Secondary | ICD-10-CM | POA: Insufficient documentation

## 2020-07-31 DIAGNOSIS — Z3A01 Less than 8 weeks gestation of pregnancy: Secondary | ICD-10-CM | POA: Insufficient documentation

## 2020-07-31 DIAGNOSIS — O26891 Other specified pregnancy related conditions, first trimester: Secondary | ICD-10-CM | POA: Insufficient documentation

## 2020-07-31 DIAGNOSIS — R0602 Shortness of breath: Secondary | ICD-10-CM | POA: Insufficient documentation

## 2020-07-31 DIAGNOSIS — Z87891 Personal history of nicotine dependence: Secondary | ICD-10-CM | POA: Insufficient documentation

## 2020-07-31 DIAGNOSIS — R059 Cough, unspecified: Secondary | ICD-10-CM | POA: Insufficient documentation

## 2020-07-31 LAB — PREGNANCY, URINE: Preg Test, Ur: POSITIVE — AB

## 2020-07-31 NOTE — ED Triage Notes (Addendum)
Sob for a week. 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 Her test have been negative. Her oxygen sats are 100%. She is speaking in complete sentences. States it comes and goes for a week. She had a positive home pregnancy test a week ago. States she is not here for that reason. She became tearful at triage.

## 2020-07-31 NOTE — ED Provider Notes (Signed)
[facility] EMERGENCY DEPARTMENT Provider Note   CSN: [id number] Arrival date & time: 07/31/20  1118     History Chief Complaint  Patient presents with   Shortness of Breath    Brandy Mckenzie is a 24 y.o. female presenting to ED with sob.  Reports gradual onset of pleuritic CP and SOB 3-4 weeks ago.  Began having a cough this week.  SOB is worse when lying down and with exertion, better when sitting upright.  No fevers or chills.  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    She had a positive home pregnancy test 2 weeks ago.  LMP was 6 weeks ago.  She has had several prior pregnancies.  She hasn't decided whether she intends to keep this pregnancy.  Denies any home meds aside from tylenol and nyquil.   NKDA  No hx of asthma.   No hemoptysis or asymmetric LE edema. Patient denies personal or family history of DVT or PE. No recent hormone use (including OCP); travel for >6 hours; prolonged immobilization for greater than 3 days; surgeries or trauma in the last 4 weeks; or malignancy with treatment within 6 months.   HPI     Past Medical History:  Diagnosis Date   Medical history non-contributory     There are no problems to display for this patient.   Past Surgical History:  Procedure Laterality Date   NO PAST SURGERIES       OB History    Gravida  2   Para      Term      Preterm      AB      Living        SAB      IAB      Ectopic      Multiple      Live Births              No family history on file.  Social History   Tobacco Use   Smoking status: Former Smoker    Quit date: 05/24/2015    Years since quitting: 5.1   Smokeless tobacco: Never Used  Substance Use Topics   Alcohol use: No   Drug use: No    Home Medications Prior to Admission medications   Medication Sig Start Date End Date Taking? Authorizing  Provider  ibuprofen (ADVIL,MOTRIN) 800 MG tablet Take 1 tablet (800 mg total) by mouth every 8 (eight) hours as needed. 02/04/18   Tilden Fossa, MD    Allergies    Patient has no known allergies.  Review of Systems   Review of Systems  Constitutional: Negative for chills and fever.  HENT: Negative for ear pain and sore throat.   Eyes: Negative for pain and visual disturbance.  Respiratory: Positive for cough and shortness of breath.   Cardiovascular: Negative for chest pain and palpitations.  Gastrointestinal: Negative for abdominal pain and vomiting.  Genitourinary: Negative for dysuria and hematuria.  Musculoskeletal: Negative for arthralgias and back pain.  Skin: Negative for color change and rash.  Neurological: Negative for syncope and light-headedness.  All other systems reviewed and are negative.   Physical Exam Updated Vital Signs BP 119/85 (BP Location: Right Arm)    Pulse 65    Temp 98.4 F (36.9 C) (Oral)    Resp 18    Ht 5\' 3"  (1.6 m)  Wt 70.9 kg    SpO2 100%    BMI 27.69 kg/m   Physical Exam Vitals and nursing note reviewed.  Constitutional:      General: She is not in acute distress.    Appearance: She is well-developed and well-nourished.  HENT:     Head: Normocephalic and atraumatic.  Eyes:     Conjunctiva/sclera: Conjunctivae normal.  Cardiovascular:     Rate and Rhythm: Normal rate and regular rhythm.     Heart sounds: No murmur heard.   Pulmonary:     Effort: Pulmonary effort is normal. No respiratory distress.     Breath sounds: Normal breath sounds.     Comments: 99% on room air Abdominal:     Palpations: Abdomen is soft.     Tenderness: There is no abdominal tenderness.  Musculoskeletal:        General: No edema.     Cervical back: Neck supple.     Right lower leg: No edema.     Left lower leg: No edema.  Skin:    General: Skin is warm and dry.  Neurological:     General: No focal deficit present.     Mental Status: She is alert and  oriented to person, place, and time.  Psychiatric:        Mood and Affect: Mood and affect normal.     ED Results / Procedures / Treatments   Labs (all labs ordered are listed, but only abnormal results are displayed) Labs Reviewed  PREGNANCY, URINE - Abnormal; Notable for the following components:      Result Value   Preg Test, Ur POSITIVE (*)    All other components within normal limits    EKG None  Radiology DG Chest 2 View  Result Date: 07/31/2020 CLINICAL DATA:  Short of breath for 1 week EXAM: CHEST - 2 VIEW COMPARISON:  None. FINDINGS: The heart size and mediastinal contours are within normal limits. Both lungs are clear. The visualized skeletal structures are unremarkable. IMPRESSION: No active cardiopulmonary disease. Electronically Signed   By: Sharlet Salina M.D.   On: 07/31/2020 17:48    Procedures Procedures (including critical care time)  Medications Ordered in ED Medications - No data to display  ED Course  I have reviewed the triage vital signs and the nursing notes.  Pertinent labs & imaging results that were available during my care of the patient were reviewed by me and considered in my medical decision making (see chart for details).   24 yo female here with SOB, dyspnea for several days Found to be pregnant here - approx 5-6 weeks by LMP.  Unsure if she wants to keep the pregnancy for now.  Needs to establish care with OBGYN.  DDx includes bronchitis vs viral illness vs PNA vs PE vs other No other clinical signs or symptoms of new onset CHF at this time Xray ordered and reviewed - no PTX, no PNA evident on this film  She reports weekly negative covid tests including this week, and has had 2 doses of the vaccine.  Less likely Covid at this time  On reassessment her vitals are stable, no hypoxia, appears comfortable in the room.  Clinical Course as of 08/01/20 0042  Mon Jul 31, 2020  1815 Xray unremarkable.  I had a discussion in shared decision  making regarding PE workup.  Although overall my suspicion is low for a large PE (no hypoxia or tachycardia, no PERC risk factors), I explained her pregnancy  state may cause a marginal increase in clotting risk.  I opted to obtain Ddimer and CT PE if needed, versus watchful waiting at home, as this may be a viral bronchitis that simply needs more time to resolve.  She prefers to go home and monitor her symptoms.  We'll give her OBGYN referral, she can see them in the office this week. [MT]    Clinical Course User Index [MT] Nakai Pollio, Kermit Balo, MD    Final Clinical Impression(s) / ED Diagnoses Final diagnoses:  Shortness of breath  Less than [redacted] weeks gestation of pregnancy    Rx / DC Orders ED Discharge Orders    None       Jamison Yuhasz, Kermit Balo, MD 08/01/20 [phone]

## 2020-07-31 NOTE — Discharge Instructions (Addendum)
Your xray did not show signs of pneumonia.  We talked about further work up for blood clots in the lungs, but you decided at this time you would prefer to monitor your symptoms at home.  I felt this was a reasonable plan.  You may have a viral bronchitis that will resolve in a few more days.  I recommend tylenol at home for your pain, and honey before bed for your cough.    Your pregnancy test is positive. You told me that you haven't decided the future of your pregnancy yet.  Because of this, I have not given you any medications that can potentially harm your pregnancy.  I advised that you establish care with an OBGYN doctor to monitor your pregnancy.  *  There are many options for quick evaluation and management of certain GYN issues that do not require a long wait time or big bill from the emergency department.   Consider these options for your care in the future: Walk-ins for certain complaints available at: [facility] [address] [phone] See hours at [email or web address]   Center for [employer] [facility] [employer] [facility] [address] [phone]   [facility] for [employer] at [employer] [phone]   You can make an appointment to see a GYN provider: Center for [facility] at [facility] [address] [phone]   Center for [facility] at [facility] [address] [employer] [phone] Center for [facility] Healthcare at [facility] [place] [phone]   Center for [facility] at [facility] [address] [phone]   If you already have an established OB/GYN provider in the area you can make an appointment with them as well.

## 2020-07-31 NOTE — ED Notes (Signed)
Pt here with complaints of SOB and feels like she has to pull hard when she takes in a breath. SPO2, HR and RR WNL. Breath sounds clear to auscultation.

## 2021-01-18 ENCOUNTER — Other Ambulatory Visit: Payer: Self-pay

## 2021-01-18 ENCOUNTER — Emergency Department (HOSPITAL_BASED_OUTPATIENT_CLINIC_OR_DEPARTMENT_OTHER)
Admission: EM | Admit: 2021-01-18 | Discharge: 2021-01-18 | Disposition: A | Discharge status: Home / Self Care | Payer: Medicaid Other | Attending: Emergency Medicine | Admitting: Emergency Medicine

## 2021-01-18 ENCOUNTER — Encounter (HOSPITAL_BASED_OUTPATIENT_CLINIC_OR_DEPARTMENT_OTHER): Payer: Self-pay

## 2021-01-18 DIAGNOSIS — Z3A32 32 weeks gestation of pregnancy: Secondary | ICD-10-CM | POA: Insufficient documentation

## 2021-01-18 DIAGNOSIS — K0889 Other specified disorders of teeth and supporting structures: Secondary | ICD-10-CM | POA: Diagnosis not present

## 2021-01-18 DIAGNOSIS — Z20822 Contact with and (suspected) exposure to covid-19: Secondary | ICD-10-CM | POA: Diagnosis not present

## 2021-01-18 DIAGNOSIS — E860 Dehydration: Secondary | ICD-10-CM | POA: Diagnosis not present

## 2021-01-18 DIAGNOSIS — O26893 Other specified pregnancy related conditions, third trimester: Secondary | ICD-10-CM | POA: Diagnosis present

## 2021-01-18 DIAGNOSIS — R55 Syncope and collapse: Secondary | ICD-10-CM | POA: Diagnosis not present

## 2021-01-18 LAB — COMPREHENSIVE METABOLIC PANEL
ALT: 16 U/L (ref 0–44)
AST: 24 U/L (ref 15–41)
Albumin: 2.9 g/dL — ABNORMAL LOW (ref 3.5–5.0)
Alkaline Phosphatase: 57 U/L (ref 38–126)
Anion gap: 6 (ref 5–15)
BUN: 7 mg/dL (ref 6–20)
CO2: 21 mmol/L — ABNORMAL LOW (ref 22–32)
Calcium: 8.2 mg/dL — ABNORMAL LOW (ref 8.9–10.3)
Chloride: 106 mmol/L (ref 98–111)
Creatinine, Ser: 0.65 mg/dL (ref 0.44–1.00)
GFR, Estimated: >60 mL/min (ref >60)
Glucose, Bld: 123 mg/dL — ABNORMAL HIGH (ref 70–99)
Potassium: 3.5 mmol/L (ref 3.5–5.1)
Sodium: 133 mmol/L — ABNORMAL LOW (ref 135–145)
Total Bilirubin: 0.3 mg/dL (ref 0.3–1.2)
Total Protein: 6.5 g/dL (ref 6.5–8.1)

## 2021-01-18 LAB — RESP PANEL BY RT-PCR (FLU A&B, COVID) ARPGX2
Influenza A by PCR: NEGATIVE
Influenza B by PCR: NEGATIVE
SARS Coronavirus 2 by RT PCR: NEGATIVE

## 2021-01-18 LAB — URINALYSIS, ROUTINE W REFLEX MICROSCOPIC
Bilirubin Urine: NEGATIVE
Glucose, UA: NEGATIVE mg/dL
Hgb urine dipstick: NEGATIVE
Ketones, ur: NEGATIVE mg/dL
Leukocytes,Ua: NEGATIVE
Nitrite: NEGATIVE
Protein, ur: NEGATIVE mg/dL
Specific Gravity, Urine: <1.005 — ABNORMAL LOW (ref 1.005–1.030)
pH: 6.5 (ref 5.0–8.0)

## 2021-01-18 LAB — CBC WITH DIFFERENTIAL/PLATELET
Abs Immature Granulocytes: 0.09 10*3/uL — ABNORMAL HIGH (ref 0.00–0.07)
Basophils Absolute: 0 10*3/uL (ref 0.0–0.1)
Basophils Relative: 0 %
Eosinophils Absolute: 0.1 10*3/uL (ref 0.0–0.5)
Eosinophils Relative: 1 %
HCT: 32.4 % — ABNORMAL LOW (ref 36.0–46.0)
Hemoglobin: 10.6 g/dL — ABNORMAL LOW (ref 12.0–15.0)
Immature Granulocytes: 1 %
Lymphocytes Relative: 19 %
Lymphs Abs: 1.7 10*3/uL (ref 0.7–4.0)
MCH: 29 pg (ref 26.0–34.0)
MCHC: 32.7 g/dL (ref 30.0–36.0)
MCV: 88.8 fL (ref 80.0–100.0)
Monocytes Absolute: 0.8 10*3/uL (ref 0.1–1.0)
Monocytes Relative: 8 %
Neutro Abs: 6.3 10*3/uL (ref 1.7–7.7)
Neutrophils Relative %: 71 %
Platelets: 226 10*3/uL (ref 150–400)
RBC: 3.65 MIL/uL — ABNORMAL LOW (ref 3.87–5.11)
RDW: 12.5 % (ref 11.5–15.5)
WBC: 8.9 10*3/uL (ref 4.0–10.5)
nRBC: 0 % (ref 0.0–0.2)

## 2021-01-18 LAB — LIPASE, BLOOD: Lipase: 47 U/L (ref 11–51)

## 2021-01-18 MED ORDER — SODIUM CHLORIDE 0.9 % IV BOLUS
1000.0000 mL | Freq: Once | INTRAVENOUS | Status: AC
  Administered 2021-01-18: 1000 mL via INTRAVENOUS

## 2021-01-18 MED ORDER — PENICILLIN V POTASSIUM 500 MG PO TABS: 500.0000 mg | ORAL_TABLET | Freq: Four times a day (QID) | ORAL | 0 refills | Status: AC

## 2021-01-18 NOTE — ED Notes (Signed)
Pt denies any contractions at this time but states that her first child was 8 weeks early and her 2nd was 4 week early  Denies any fluids leaking

## 2021-01-18 NOTE — ED Notes (Signed)
Pt placed on TOCO and rapid OB notified and they can monitor baby well ,  ERin  will call  If any issues

## 2021-01-18 NOTE — Discharge Instructions (Addendum)
You were seen in the emergency department today after feeling lightheaded with standing.  I suspect you are dehydrated related to your dental pain.  We given you IV fluids.  Your lab work here is reassuring.  Please call your OB/GYN tomorrow.  I am starting on antibiotics for your dental pain.  He will ultimately need follow-up with a dentist.  Please call their office tomorrow as well.

## 2021-01-18 NOTE — ED Notes (Signed)
FHT 146

## 2021-01-18 NOTE — Progress Notes (Signed)
Pt. Cleared by OB attending Dr. Charlotta Newton. Pt. Removed from monitor at this time. Pt. Does not have any obstetrical complaints at this time.

## 2021-01-18 NOTE — ED Provider Notes (Signed)
Emergency Department Provider Note   I have reviewed the triage vital signs and the nursing notes.   HISTORY  Chief Complaint Near Syncope (32 weeks preg)   HPI Brandy Mckenzie is a 25 y.o. female [id number] at 32 wks presents to the ED with episode of lightheadedness earlier today.  Patient states she has been having dental pain which is acute on chronic and leading to her eating and drinking less than normal.  She denies any fevers.  No difficulty swallowing or voice changes.  She felt lightheaded with standing earlier today but did not pass out.  She denies any chest pain or heart palpitations.  No fevers or chills.  She has some "tightness" in her abdomen occasionally but not today.  No pain that feels like labor to her.  No vaginal fluid/bleeding noted. Notes that her two prior children both came early and that her OB delivers babies at [facility]. Last seen by Dr. Allena Katz there on 01/11/21 in chart review.   Past Medical History:  Diagnosis Date   Medical history non-contributory     There are no problems to display for this patient.   Past Surgical History:  Procedure Laterality Date   NO PAST SURGERIES      Allergies Patient has no known allergies.  No family history on file.  Social History Social History   Tobacco Use   Smoking status: Never   Smokeless tobacco: Never  Substance Use Topics   Alcohol use: No   Drug use: No    Review of Systems  Constitutional: No fever/chills. Positive pain all over.  Eyes: No visual changes. ENT: No sore throat. Positive dental pain.  Cardiovascular: Denies chest pain. Positive near syncope.  Respiratory: Denies shortness of breath. Gastrointestinal: No abdominal pain.  No nausea, no vomiting.  No diarrhea.  No constipation. Genitourinary: Negative for dysuria. Musculoskeletal: Negative for back pain. Skin: Negative for rash. Neurological: Negative for headaches, focal weakness or numbness.  10-point ROS  otherwise negative.  ____________________________________________   PHYSICAL EXAM:  VITAL SIGNS: ED Triage Vitals  Enc Vitals Group     BP 01/18/21 1726 120/76     Pulse Rate 01/18/21 1726 (!) 118     Resp 01/18/21 1726 16     Temp 01/18/21 1726 98.4 F (36.9 C)     Temp Source 01/18/21 1726 Oral     SpO2 01/18/21 1726 100 %     Weight 01/18/21 1725 155 lb (70.3 kg)     Height 01/18/21 1725 5\' 3"  (1.6 m)   Constitutional: Alert and oriented. Well appearing and in no acute distress. Eyes: Conjunctivae are normal Head: Atraumatic. Nose: No congestion/rhinnorhea. Mouth/Throat: Mucous membranes are moist.  Oropharynx non-erythematous. Chronic appearing area of dental decay in the left lower jaw. No visible abscess. No bleeding. No trismus. Soft submandibular compartment.  Neck: No stridor.   Cardiovascular: Normal rate, regular rhythm. Good peripheral circulation. Grossly normal heart sounds.   Respiratory: Normal respiratory effort.  No retractions. Lungs CTAB. Gastrointestinal: Soft and nontender. Positive gravid abdomen.  Musculoskeletal: No lower extremity tenderness nor edema. No gross deformities of extremities. Neurologic:  Normal speech and language. No gross focal neurologic deficits are appreciated.  Skin:  Skin is warm, dry and intact. No rash noted.  ____________________________________________   LABS (all labs ordered are listed, but only abnormal results are displayed)  Labs Reviewed  COMPREHENSIVE METABOLIC PANEL - Abnormal; Notable for the following components:      Result Value  Sodium 133 (*)    CO2 21 (*)    Glucose, Bld 123 (*)    Calcium 8.2 (*)    Albumin 2.9 (*)    All other components within normal limits  CBC WITH DIFFERENTIAL/PLATELET - Abnormal; Notable for the following components:   RBC 3.65 (*)    Hemoglobin 10.6 (*)    HCT 32.4 (*)    Abs Immature Granulocytes 0.09 (*)    All other components within normal limits  URINALYSIS, ROUTINE W  REFLEX MICROSCOPIC - Abnormal; Notable for the following components:   Specific Gravity, Urine <1.005 (*)    All other components within normal limits  RESP PANEL BY RT-PCR (FLU A&B, COVID) ARPGX2  LIPASE, BLOOD   ____________________________________________  EKG   EKG Interpretation  Date/Time:  Thursday January 18 2021 17:56:26 EDT Ventricular Rate:  106 PR Interval:  123 QRS Duration: 86 QT Interval:  322 QTC Calculation: 428 R Axis:   46 Text Interpretation: Sinus tachycardia Ventricular premature complex Aberrant complex Abnormal T, consider ischemia, anterior leads Baseline wander in lead(s) V6 No old tracing for comparison Confirmed by Alona Bene [phone]) on 01/18/2021 6:42:35 PM         ____________________________________________  RADIOLOGY  None   ____________________________________________   PROCEDURES  Procedure(s) performed:   Procedures  None  ____________________________________________   INITIAL IMPRESSION / ASSESSMENT AND PLAN / ED COURSE  Pertinent labs & imaging results that were available during my care of the patient were reviewed by me and considered in my medical decision making (see chart for details).   Patient [id number] at 32 wks presents with near syncope yesterday in the setting of decreased p.o. intake from dental pain.  I do not see an obvious dental abscess.  No findings to suspect deeper space neck/face infection. No symptoms to suspect pre-term labor.   Patient cleared by OB/GYN after monitoring.  She is feeling improved after IV fluids.  Advised close follow-up with OB/GYN.  Will start on penicillin for dental pain and possible periapical abscess.  Patient will need dental follow-up and she will call tomorrow.  ____________________________________________  FINAL CLINICAL IMPRESSION(S) / ED DIAGNOSES  Final diagnoses:  Near syncope  Pain, dental  Dehydration     MEDICATIONS GIVEN DURING THIS VISIT:  Medications  sodium  chloride 0.9 % bolus 1,000 mL (0 mLs Intravenous Stopped 01/18/21 1905)     NEW OUTPATIENT MEDICATIONS STARTED DURING THIS VISIT:  Discharge Medication List as of 01/18/2021  7:40 PM     START taking these medications   Details  penicillin v potassium (VEETID) 500 MG tablet Take 1 tablet (500 mg total) by mouth 4 (four) times daily for 7 days., Starting Thu 01/18/2021, Until Thu 01/25/2021, Normal        Note:  This document was prepared using Dragon voice recognition software and may include unintentional dictation errors.  Alona Bene, MD, [facility]    Rolla Servidio, Arlyss Repress, MD 01/20/21 1106

## 2021-01-18 NOTE — ED Triage Notes (Addendum)
Pt c/o feeling like she was going to pass out, dizziness x 2 episodes-once yesterday and once today-c/o "pain all over" and left lower dental pain (states she has appt to have tooth extracted next week)-NAD-steady gait-pt states she is [redacted] weeks pregnant with prenatal care

## 2021-01-18 NOTE — ED Notes (Signed)
Pt is G3 P 2 A0 L2 [facility] Aug 17

## 2023-10-18 ENCOUNTER — Other Ambulatory Visit: Payer: Self-pay

## 2023-10-18 ENCOUNTER — Emergency Department (HOSPITAL_BASED_OUTPATIENT_CLINIC_OR_DEPARTMENT_OTHER)

## 2023-10-18 ENCOUNTER — Encounter (HOSPITAL_BASED_OUTPATIENT_CLINIC_OR_DEPARTMENT_OTHER): Payer: Self-pay | Admitting: Emergency Medicine

## 2023-10-18 ENCOUNTER — Emergency Department (HOSPITAL_BASED_OUTPATIENT_CLINIC_OR_DEPARTMENT_OTHER)
Admission: EM | Admit: 2023-10-18 | Discharge: 2023-10-18 | Disposition: A | Discharge status: Home / Self Care | Attending: Emergency Medicine | Admitting: Emergency Medicine

## 2023-10-18 DIAGNOSIS — R0602 Shortness of breath: Secondary | ICD-10-CM | POA: Insufficient documentation

## 2023-10-18 LAB — CBC WITH DIFFERENTIAL/PLATELET
Abs Immature Granulocytes: 0.18 10*3/uL — ABNORMAL HIGH (ref 0.00–0.07)
Basophils Absolute: 0 10*3/uL (ref 0.0–0.1)
Basophils Relative: 0 %
Eosinophils Absolute: 0.1 10*3/uL (ref 0.0–0.5)
Eosinophils Relative: 1 %
HCT: 32.8 % — ABNORMAL LOW (ref 36.0–46.0)
Hemoglobin: 10.8 g/dL — ABNORMAL LOW (ref 12.0–15.0)
Immature Granulocytes: 1 %
Lymphocytes Relative: 17 %
Lymphs Abs: 3.4 10*3/uL (ref 0.7–4.0)
MCH: 29.8 pg (ref 26.0–34.0)
MCHC: 32.9 g/dL (ref 30.0–36.0)
MCV: 90.4 fL (ref 80.0–100.0)
Monocytes Absolute: 1.8 10*3/uL — ABNORMAL HIGH (ref 0.1–1.0)
Monocytes Relative: 9 %
Neutro Abs: 14.4 10*3/uL — ABNORMAL HIGH (ref 1.7–7.7)
Neutrophils Relative %: 72 %
Platelets: 482 10*3/uL — ABNORMAL HIGH (ref 150–400)
RBC: 3.63 MIL/uL — ABNORMAL LOW (ref 3.87–5.11)
RDW: 13.3 % (ref 11.5–15.5)
WBC: 20 10*3/uL — ABNORMAL HIGH (ref 4.0–10.5)
nRBC: 0 % (ref 0.0–0.2)

## 2023-10-18 LAB — HCG, QUANTITATIVE, PREGNANCY: hCG, Beta Chain, Quant, S: 3 m[IU]/mL (ref <5)

## 2023-10-18 LAB — BASIC METABOLIC PANEL
Anion gap: 14 (ref 5–15)
BUN: 8 mg/dL (ref 6–20)
CO2: 22 mmol/L (ref 22–32)
Calcium: 9.3 mg/dL (ref 8.9–10.3)
Chloride: 100 mmol/L (ref 98–111)
Creatinine, Ser: 0.78 mg/dL (ref 0.44–1.00)
GFR, Estimated: >60 mL/min (ref >60)
Glucose, Bld: 111 mg/dL — ABNORMAL HIGH (ref 70–99)
Potassium: 3.1 mmol/L — ABNORMAL LOW (ref 3.5–5.1)
Sodium: 136 mmol/L (ref 135–145)

## 2023-10-18 LAB — D-DIMER, QUANTITATIVE: D-Dimer, Quant: 1.47 ug{FEU}/mL — ABNORMAL HIGH (ref 0.00–0.50)

## 2023-10-18 MED ORDER — IOHEXOL 300 MG/ML  SOLN: 80.0000 mL | Freq: Once | INTRAMUSCULAR | Status: DC | PRN

## 2023-10-18 MED ORDER — IOHEXOL 350 MG/ML SOLN
80.0000 mL | Freq: Once | INTRAVENOUS | Status: AC | PRN
  Administered 2023-10-18: 80 mL via INTRAVENOUS

## 2023-10-18 NOTE — Discharge Instructions (Signed)
 While you are in the emergency room, you had blood work done that was consistent with someone who just recently had a surgery.  You had a CT scan done of your chest that was normal.  Continue taking all of your medications as prescribed, and continue to rest and recover from your surgery.

## 2023-10-18 NOTE — ED Triage Notes (Signed)
 Pt reports she had liposuction and Sudan Butt Lift surgery completed in [place], [place] on 10/09/2023, pt reports flying home after surgery, has been experiencing SOB since last night, pt not on thinners

## 2023-10-18 NOTE — ED Provider Notes (Signed)
 [facility] Provider Note   CSN: [id number] Arrival date & time: 10/18/23  0537     History  Chief Complaint  Patient presents with   Shortness of Breath    Brandy Mckenzie is a 28 y.o. female.  The history is provided by the patient.  Shortness of Breath She complains of shortness of breath which started tonight.  It is not unusual for her to get short of breath, but tonight she felt very anxious.  She denies chest pain, heaviness, tightness, pressure.  She denies cough.  Denies fever or chills.  Of note, she had liposuction and Brazilian butt lift surgery on 10/09/2023, flew home from [place] 5 days ago.  She is a non-smoker.   Home Medications Prior to Admission medications   Medication Sig Start Date End Date Taking? Authorizing Provider  ibuprofen (ADVIL,MOTRIN) 800 MG tablet Take 1 tablet (800 mg total) by mouth every 8 (eight) hours as needed. 02/04/18   Tilden Fossa, MD      Allergies    Patient has no known allergies.    Review of Systems   Review of Systems  Respiratory:  Positive for shortness of breath.   All other systems reviewed and are negative.   Physical Exam Updated Vital Signs BP (!) 138/91 (BP Location: Left Arm)   Pulse 98   Resp 16   Ht 5\' 3"  (1.6 m)   Wt 70.3 kg   LMP 10/17/2023 (Exact Date)   SpO2 98%   BMI 27.46 kg/m  Physical Exam Vitals and nursing note reviewed.   28 year old female, resting comfortably and in no acute distress. Vital signs are significant for borderline elevated blood pressure. Oxygen saturation is 98%, which is normal. Head is normocephalic and atraumatic. PERRLA, EOMI. Oropharynx is clear. Neck is nontender and supple without adenopathy. Lungs are clear without rales, wheezes, or rhonchi. Chest is nontender. Heart has regular rate and rhythm without murmur. Abdomen is soft, flat, nontender. Extremities have no cyanosis or edema, full range of motion is present. Skin  is warm and dry without rash. Neurologic: Mental status is normal, cranial nerves are intact, moves all extremities equally.  ED Results / Procedures / Treatments   Labs (all labs ordered are listed, but only abnormal results are displayed) Labs Reviewed  BASIC METABOLIC PANEL  CBC WITH DIFFERENTIAL/PLATELET  D-DIMER, QUANTITATIVE    EKG EKG Interpretation Date/Time:  Saturday October 18 2023 06:14:35 EDT Ventricular Rate:  104 PR Interval:  134 QRS Duration:  96 QT Interval:  306 QTC Calculation: 403 R Axis:   84  Text Interpretation: Sinus tachycardia Consider right ventricular hypertrophy Nonspecific T abnrm, anterolateral leads When compared with ECG of 01/18/2021, T wave inversion Anterior leads is no longer present Confirmed by Dione Booze ([id number]) on 10/18/2023 6:21:21 AM  Radiology No results found.  Procedures Procedures  Cardiac monitor shows normal sinus rhythm, per my interpretation.  Medications Ordered in ED Medications - No data to display  ED Course/ Medical Decision Making/ A&P                                 Medical Decision Making Amount and/or Complexity of Data Reviewed Labs: ordered. Radiology: ordered.   Shortness of breath and patient who is postop recent Sudan butt lift surgery and liposuction.  Because of recent surgery, she is at risk for pulmonary embolism.  Exam is benign.  I have ordered chest x-ray to rule out pneumonia and D-dimer to rule out pulmonary embolism.  If D-dimer is positive, she will need to proceed with CT angiogram of the chest.  I have reviewed her electrocardiogram, and my interpretation is sinus tachycardia, nonspecific T wave flattening.  When compared with prior ECG, prior T wave inversions in the anterior leads are no longer present.  Case is signed out to Dr. Andria Meuse, oncoming physician.  Final Clinical Impression(s) / ED Diagnoses Final diagnoses:  SOB (shortness of breath)    Rx / DC Orders ED Discharge Orders      None         Dione Booze, MD 10/18/23 [phone]

## 2023-10-18 NOTE — ED Provider Notes (Signed)
  Physical Exam  BP (!) 138/91 (BP Location: Left Arm)   Pulse 98   Resp 16   Ht 5\' 3"  (1.6 m)   Wt 70.3 kg   LMP 10/17/2023 (Exact Date)   SpO2 98%   BMI 27.46 kg/m   Physical Exam  Procedures  Procedures  ED Course / MDM    Medical Decision Making I, Anders Simmonds, assumed care for this patient.  In brief 28 year old female 5 days postop here today for shortness of breath.  CTA negative.  Satting 98%, respirations 16.  Patient with clear lung sounds.  Will discharge.  Amount and/or Complexity of Data Reviewed Labs: ordered. Radiology: ordered.  Risk Prescription drug management.          Anders Simmonds T, DO 10/18/23 [phone]

## 2023-12-28 ENCOUNTER — Emergency Department (HOSPITAL_BASED_OUTPATIENT_CLINIC_OR_DEPARTMENT_OTHER)
Admission: EM | Admit: 2023-12-28 | Discharge: 2023-12-28 | Disposition: A | Discharge status: Home / Self Care | Attending: Emergency Medicine | Admitting: Emergency Medicine

## 2023-12-28 ENCOUNTER — Emergency Department (HOSPITAL_BASED_OUTPATIENT_CLINIC_OR_DEPARTMENT_OTHER)

## 2023-12-28 ENCOUNTER — Other Ambulatory Visit: Payer: Self-pay

## 2023-12-28 ENCOUNTER — Encounter (HOSPITAL_BASED_OUTPATIENT_CLINIC_OR_DEPARTMENT_OTHER): Payer: Self-pay

## 2023-12-28 DIAGNOSIS — R079 Chest pain, unspecified: Secondary | ICD-10-CM | POA: Diagnosis present

## 2023-12-28 DIAGNOSIS — R0789 Other chest pain: Secondary | ICD-10-CM | POA: Insufficient documentation

## 2023-12-28 DIAGNOSIS — Z3201 Encounter for pregnancy test, result positive: Secondary | ICD-10-CM | POA: Diagnosis not present

## 2023-12-28 LAB — CBC WITH DIFFERENTIAL/PLATELET
Abs Immature Granulocytes: 0.03 10*3/uL (ref 0.00–0.07)
Basophils Absolute: 0 10*3/uL (ref 0.0–0.1)
Basophils Relative: 0 %
Eosinophils Absolute: 0.1 10*3/uL (ref 0.0–0.5)
Eosinophils Relative: 1 %
HCT: 39.6 % (ref 36.0–46.0)
Hemoglobin: 13.1 g/dL (ref 12.0–15.0)
Immature Granulocytes: 0 %
Lymphocytes Relative: 30 %
Lymphs Abs: 2.5 10*3/uL (ref 0.7–4.0)
MCH: 29 pg (ref 26.0–34.0)
MCHC: 33.1 g/dL (ref 30.0–36.0)
MCV: 87.6 fL (ref 80.0–100.0)
Monocytes Absolute: 0.6 10*3/uL (ref 0.1–1.0)
Monocytes Relative: 7 %
Neutro Abs: 5.2 10*3/uL (ref 1.7–7.7)
Neutrophils Relative %: 62 %
Platelets: 330 10*3/uL (ref 150–400)
RBC: 4.52 MIL/uL (ref 3.87–5.11)
RDW: 13.8 % (ref 11.5–15.5)
WBC: 8.5 10*3/uL (ref 4.0–10.5)
nRBC: 0 % (ref 0.0–0.2)

## 2023-12-28 LAB — COMPREHENSIVE METABOLIC PANEL WITH GFR
ALT: 34 U/L (ref 0–44)
AST: 25 U/L (ref 15–41)
Albumin: 4.1 g/dL (ref 3.5–5.0)
Alkaline Phosphatase: 53 U/L (ref 38–126)
Anion gap: 14 (ref 5–15)
BUN: 11 mg/dL (ref 6–20)
CO2: 20 mmol/L — ABNORMAL LOW (ref 22–32)
Calcium: 9.3 mg/dL (ref 8.9–10.3)
Chloride: 100 mmol/L (ref 98–111)
Creatinine, Ser: 0.68 mg/dL (ref 0.44–1.00)
GFR, Estimated: >60 mL/min (ref >60)
Glucose, Bld: 90 mg/dL (ref 70–99)
Potassium: 3.9 mmol/L (ref 3.5–5.1)
Sodium: 134 mmol/L — ABNORMAL LOW (ref 135–145)
Total Bilirubin: 0.3 mg/dL (ref 0.0–1.2)
Total Protein: 7.8 g/dL (ref 6.5–8.1)

## 2023-12-28 LAB — TROPONIN T, HIGH SENSITIVITY
Troponin T High Sensitivity: <15 ng/L (ref <19)
Troponin T High Sensitivity: <15 ng/L (ref <19)

## 2023-12-28 LAB — D-DIMER, QUANTITATIVE: D-Dimer, Quant: <0.27 ug{FEU}/mL (ref 0.00–0.50)

## 2023-12-28 LAB — HCG, SERUM, QUALITATIVE: Preg, Serum: POSITIVE — AB

## 2023-12-28 NOTE — ED Triage Notes (Signed)
 Pov/ driving home from [place] "and started to feel weird"/ pt c/o chest pressure 5/10 currently/ pt states she also was lightheaded but denies at this time/ pt is ambulatory and A&Ox4

## 2023-12-28 NOTE — ED Provider Notes (Signed)
  EMERGENCY DEPARTMENT AT [facility] Provider Note   CSN: [id number] Arrival date & time: 12/28/23  0006     History  Chief Complaint  Patient presents with   Chest Pain    Brandy Mckenzie is a 28 y.o. female.  The history is provided by the patient and medical records.  Chest Pain Brandy Mckenzie is a 28 y.o. female who presents to the Emergency Department complaining of chest pain.  She presents to the emergency department for evaluation of aching chest pain that started while she was driving home from [place].  Around the same time her back felt hot and she felt like she was get a pass out.  Symptoms started about 1 hour ago.  Overall they are improving but she still has chest discomfort.  She did have lipo 360 in March.  No leg swelling.  No fever, cough, nausea.  She did vomit once.  No known medical problems.  No history of DVT/PE.  No recent stress.  No supplements.  No oral contraceptives.  No significant past family medical history.      Home Medications Prior to Admission medications   Medication Sig Start Date End Date Taking? Authorizing Provider  ibuprofen  (ADVIL ,MOTRIN ) 800 MG tablet Take 1 tablet (800 mg total) by mouth every 8 (eight) hours as needed. 02/04/18   Kelsey Patricia, MD      Allergies    Patient has no known allergies.    Review of Systems   Review of Systems  Cardiovascular:  Positive for chest pain.  All other systems reviewed and are negative.   Physical Exam Updated Vital Signs BP 117/80 (BP Location: Left Arm)   Pulse 62   Temp 98 F (36.7 C) (Oral)   Resp 17   Wt 68.5 kg   LMP 11/15/2023 (Exact Date)   SpO2 100%   BMI 26.75 kg/m  Physical Exam Vitals and nursing note reviewed.  Constitutional:      Appearance: She is well-developed.  HENT:     Head: Normocephalic and atraumatic.  Cardiovascular:     Rate and Rhythm: Normal rate and regular rhythm.     Heart sounds: No murmur heard. Pulmonary:      Effort: Pulmonary effort is normal. No respiratory distress.     Breath sounds: Normal breath sounds.  Abdominal:     Palpations: Abdomen is soft.     Tenderness: There is no abdominal tenderness. There is no guarding or rebound.  Musculoskeletal:        General: No swelling or tenderness.  Skin:    General: Skin is warm and dry.  Neurological:     Mental Status: She is alert and oriented to person, place, and time.  Psychiatric:        Behavior: Behavior normal.     ED Results / Procedures / Treatments   Labs (all labs ordered are listed, but only abnormal results are displayed) Labs Reviewed  COMPREHENSIVE METABOLIC PANEL WITH GFR - Abnormal; Notable for the following components:      Result Value   Sodium 134 (*)    CO2 20 (*)    All other components within normal limits  HCG, SERUM, QUALITATIVE - Abnormal; Notable for the following components:   Preg, Serum POSITIVE (*)    All other components within normal limits  CBC WITH DIFFERENTIAL/PLATELET  D-DIMER, QUANTITATIVE  TROPONIN T, HIGH SENSITIVITY  TROPONIN T, HIGH SENSITIVITY    EKG EKG Interpretation Date/Time:  Sunday Dec 28 2023 00:16:02 EDT Ventricular Rate:  81 PR Interval:  125 QRS Duration:  101 QT Interval:  360 QTC Calculation: 418 R Axis:   53  Text Interpretation: Sinus rhythm Nonspecific T abnormalities, anterior leads Confirmed by Kelsey Patricia [phone]) on 12/28/2023 12:31:23 AM  Radiology DG Chest 2 View Result Date: 12/28/2023 CLINICAL DATA:  chest pain, sob EXAM: CHEST - 2 VIEW COMPARISON:  Chest x-ray 10/18/2023, CT chest 10/18/2023 FINDINGS: The heart and mediastinal contours are within normal limits. No focal consolidation. No pulmonary edema. No pleural effusion. No pneumothorax. No acute osseous abnormality. IMPRESSION: No active cardiopulmonary disease. Electronically Signed   By: Morgane  Naveau M.D.   On: 12/28/2023 01:26    Procedures Procedures    Medications Ordered in  ED Medications - No data to display  ED Course/ Medical Decision Making/ A&P                                 Medical Decision Making Amount and/or Complexity of Data Reviewed Labs: ordered. Radiology: ordered.   Patient without significant medical history here for evaluation of chest pain, back pain that started while driving.  EKG is similar when compared to priors.  Troponins are negative x 2.  She did have surgery in March but has no lower extremity edema, no risk factors for DVT/PE aside from recent surgery, D-dimer is negative.  Current picture is not consistent with PE.  Chest x-ray is negative for acute abnormality.  In terms of her chest pain, source is unclear.  Discussed Tylenol  as needed, may take Tums if she has a component of reflux.  Incidentally her pregnancy test is positive.  LMP was April 12.  Discussed this finding with patient.  Feel she is stable for discharge home with outpatient OB/GYN follow-up.  Discussed return precautions for development of abdominal pain, vaginal bleeding.  Bedside ultrasound with IUP, images not saved.        Final Clinical Impression(s) / ED Diagnoses Final diagnoses:  Atypical chest pain  Positive pregnancy test    Rx / DC Orders ED Discharge Orders     None         Kelsey Patricia, MD 12/28/23 [phone]
